# Patient Record
Sex: Female | Born: 2000 | Race: Black or African American | Hispanic: No | Marital: Single | Smoking: Never smoker
Health system: Southern US, Community
[De-identification: ages and names within clinical notes are randomized; demographics above are authoritative.]

## PROBLEM LIST (undated history)

## (undated) ENCOUNTER — Ambulatory Visit: Payer: Medicaid Other | Source: Home / Self Care

## (undated) ENCOUNTER — Inpatient Hospital Stay (HOSPITAL_COMMUNITY): Payer: Self-pay

## (undated) DIAGNOSIS — A749 Chlamydial infection, unspecified: Secondary | ICD-10-CM

---
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
## 2022-03-12 ENCOUNTER — Ambulatory Visit
Admission: EM | Admit: 2022-03-12 | Discharge: 2022-03-12 | Disposition: A | Discharge status: Home / Self Care | Payer: Medicaid Other | Attending: Internal Medicine | Admitting: Internal Medicine

## 2022-03-12 DIAGNOSIS — R112 Nausea with vomiting, unspecified: Secondary | ICD-10-CM | POA: Diagnosis not present

## 2022-03-12 DIAGNOSIS — B349 Viral infection, unspecified: Secondary | ICD-10-CM | POA: Diagnosis not present

## 2022-03-12 MED ORDER — ONDANSETRON 4 MG PO TBDP: 4.0000 mg | ORAL_TABLET | Freq: Three times a day (TID) | ORAL | 0 refills | Status: DC | PRN

## 2022-03-12 NOTE — ED Triage Notes (Signed)
Pt c/o vomiting since last night. Denies diarrhea, constipation.

## 2022-03-12 NOTE — Discharge Instructions (Signed)
It appears that you have a viral illness that should run its course and self resolve with symptomatic treatment.  I have prescribed you nausea medication to take as needed.  Please ensure adequate fluid hydration with clear oral fluids that include Gatorade, Pedialyte, water.  Follow-up with emergency department if symptoms persist or worsen or if you cannot keep any fluids down despite nausea medication.

## 2022-03-12 NOTE — ED Provider Notes (Addendum)
[name]    CSN: [id number] Arrival date & time: 03/12/22  1107      History   Chief Complaint Chief Complaint  Patient presents with   Emesis    HPI Sherry Barton is a 21 y.o. female.   Patient presents with nausea and vomiting that started last night.  Denies any blood in emesis.  She also denies any associated diarrhea.  She does report some lower abdominal pain described as cramping but reports this is consistent given that she is currently on her menstrual cycle.  Denies any associated upper respiratory symptoms, cough, fever.  Denies any known sick contacts, recent unfavorable foods, or travel outside the [place].  Patient reports that she is having difficulty keeping fluids down.  She has had unprotected intercourse a few months prior but states that she has been having monthly menstrual cycles since so she is not concerned for pregnancy.   Emesis   History reviewed. No pertinent past medical history.  There are no problems to display for this patient.   History reviewed. No pertinent surgical history.  OB History   No obstetric history on file.      Home Medications    Prior to Admission medications   Medication Sig Start Date End Date Taking? Authorizing Provider  ondansetron (ZOFRAN-ODT) 4 MG disintegrating tablet Take 1 tablet (4 mg total) by mouth every 8 (eight) hours as needed for nausea or vomiting. 03/12/22  Yes Zariyah Stephens, Michele Rockers, FNP    Family History Family History  Problem Relation Age of Onset   Healthy Father     Social History Social History   Tobacco Use   Smoking status: Never   Smokeless tobacco: Never  Substance Use Topics   Alcohol use: Never   Drug use: Yes    Types: Marijuana     Allergies   Patient has no known allergies.   Review of Systems Review of Systems Per HPI  Physical Exam Triage Vital Signs ED Triage Vitals [03/12/22 1207]  Enc Vitals Group     BP 110/66     Pulse Rate 77      Resp 16     Temp 98.8 F (37.1 C)     Temp Source Oral     SpO2 97 %     Weight      Height      Head Circumference      Peak Flow      Pain Score 7     Pain Loc      Pain Edu?      Excl. in [place]?    No data found.  Updated Vital Signs BP 110/66 (BP Location: Right Arm)   Pulse 77   Temp 98.8 F (37.1 C) (Oral)   Resp 16   SpO2 97%   Visual Acuity Right Eye Distance:   Left Eye Distance:   Bilateral Distance:    Right Eye Near:   Left Eye Near:    Bilateral Near:     Physical Exam Constitutional:      General: She is not in acute distress.    Appearance: Normal appearance. She is not toxic-appearing or diaphoretic.  HENT:     Head: Normocephalic and atraumatic.     Mouth/Throat:     Mouth: Mucous membranes are moist.     Pharynx: No posterior oropharyngeal erythema.  Eyes:     Extraocular Movements: Extraocular movements intact.     Conjunctiva/sclera: Conjunctivae normal.  Cardiovascular:  Rate and Rhythm: Normal rate and regular rhythm.     Pulses: Normal pulses.     Heart sounds: Normal heart sounds.  Pulmonary:     Effort: Pulmonary effort is normal. No respiratory distress.     Breath sounds: Normal breath sounds.  Abdominal:     General: Bowel sounds are normal. There is no distension.     Palpations: Abdomen is soft.     Tenderness: There is no abdominal tenderness.  Neurological:     General: No focal deficit present.     Mental Status: She is alert and oriented to person, place, and time. Mental status is at baseline.  Psychiatric:        Mood and Affect: Mood normal.        Behavior: Behavior normal.        Thought Content: Thought content normal.        Judgment: Judgment normal.      UC Treatments / Results  Labs (all labs ordered are listed, but only abnormal results are displayed) Labs Reviewed - No data to display  EKG   Radiology No results found.  Procedures Procedures (including critical care time)  Medications  Ordered in UC Medications - No data to display  Initial Impression / Assessment and Plan / UC Course  I have reviewed the triage vital signs and the nursing notes.  Pertinent labs & imaging results that were available during my care of the patient were reviewed by me and considered in my medical decision making (see chart for details).     Suspect viral cause to patient's symptoms.  Patient has been having monthly menstrual cycles and is currently on her menstrual cycle so there is no concern for pregnancy at this time.  No signs of dehydration or concerns for acute abdomen on exam so do not think an emergent evaluation or imaging of the abdomen is necessary.  Will treat with ondansetron and patient advised to ensure adequate fluid hydration with clear oral fluids.  She does not take any daily medications so this medication should be safe.  Patient advised to go to the ER if symptoms persist or worsen.  Patient verbalized understanding and was agreeable with plan. Final Clinical Impressions(s) / UC Diagnoses   Final diagnoses:  Nausea and vomiting, unspecified vomiting type  Viral illness     Discharge Instructions      It appears that you have a viral illness that should run its course and self resolve with symptomatic treatment.  I have prescribed you nausea medication to take as needed.  Please ensure adequate fluid hydration with clear oral fluids that include Gatorade, Pedialyte, water.  Follow-up with emergency department if symptoms persist or worsen or if you cannot keep any fluids down despite nausea medication.    ED Prescriptions     Medication Sig Dispense Auth. Provider   ondansetron (ZOFRAN-ODT) 4 MG disintegrating tablet Take 1 tablet (4 mg total) by mouth every 8 (eight) hours as needed for nausea or vomiting. 20 tablet [place], Acie Fredrickson, [place]      PDMP not reviewed this encounter.   Gustavus Bryant, [place] 03/12/22 1235    Gustavus Bryant, [place] 03/12/22 1236

## 2022-10-02 ENCOUNTER — Ambulatory Visit: Payer: Self-pay

## 2022-10-02 ENCOUNTER — Ambulatory Visit (INDEPENDENT_AMBULATORY_CARE_PROVIDER_SITE_OTHER): Payer: Medicaid Other

## 2022-10-02 ENCOUNTER — Ambulatory Visit
Admission: EM | Admit: 2022-10-02 | Discharge: 2022-10-02 | Disposition: A | Discharge status: Home / Self Care | Payer: Medicaid Other | Attending: Internal Medicine | Admitting: Internal Medicine

## 2022-10-02 DIAGNOSIS — M542 Cervicalgia: Secondary | ICD-10-CM

## 2022-10-02 DIAGNOSIS — M545 Low back pain, unspecified: Secondary | ICD-10-CM

## 2022-10-02 MED ORDER — TIZANIDINE HCL 2 MG PO TABS: 2.0000 mg | ORAL_TABLET | Freq: Two times a day (BID) | ORAL | 0 refills | Status: DC | PRN

## 2022-10-02 NOTE — Discharge Instructions (Signed)
Your x-rays were normal.  Suspect muscle strain.  I have prescribed you a muscle relaxer to take as needed for this.  Please remember that it can make you drowsy so no driving or drinking alcohol with it.  Follow-up with orthopedist if symptoms persist or worsen.
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
## 2023-04-28 ENCOUNTER — Ambulatory Visit
Admission: RE | Admit: 2023-04-28 | Discharge: 2023-04-28 | Disposition: A | Discharge status: Home / Self Care | Payer: Medicaid Other | Source: Ambulatory Visit

## 2023-04-28 ENCOUNTER — Ambulatory Visit (INDEPENDENT_AMBULATORY_CARE_PROVIDER_SITE_OTHER): Payer: Medicaid Other

## 2023-04-28 VITALS — BP 93/58 | HR 69 | Temp 98.6°F | Resp 16 | Ht 63.0 in | Wt 100.0 lb

## 2023-04-28 DIAGNOSIS — R059 Cough, unspecified: Secondary | ICD-10-CM | POA: Diagnosis not present

## 2023-04-28 DIAGNOSIS — J40 Bronchitis, not specified as acute or chronic: Secondary | ICD-10-CM | POA: Diagnosis not present

## 2023-04-28 DIAGNOSIS — J329 Chronic sinusitis, unspecified: Secondary | ICD-10-CM | POA: Diagnosis not present

## 2023-04-28 MED ORDER — PREDNISONE 20 MG PO TABS: 40.0000 mg | ORAL_TABLET | Freq: Every day | ORAL | 0 refills | Status: AC

## 2023-04-28 MED ORDER — AMOXICILLIN-POT CLAVULANATE 875-125 MG PO TABS: 1.0000 | ORAL_TABLET | Freq: Two times a day (BID) | ORAL | 0 refills | Status: DC

## 2023-04-28 NOTE — ED Provider Notes (Signed)
[name]    CSN: [id number] Arrival date & time: 04/28/23  1154      History   Chief Complaint Chief Complaint  Patient presents with   Cough    HPI Sherry Barton is a 22 y.o. female.   Patient here today for evaluation of chest congestion and body aches. She reports symptoms started about a week ago. She has not had any fever. She has taken OTC meds without resolution .   The history is provided by the patient.  Cough Associated symptoms: sore throat   Associated symptoms: no chills, no ear pain, no eye discharge, no fever, no shortness of breath and no wheezing     History reviewed. No pertinent past medical history.  There are no active problems to display for this patient.   History reviewed. No pertinent surgical history.  OB History   No obstetric history on file.      Home Medications    Prior to Admission medications   Medication Sig Start Date End Date Taking? Authorizing Provider  amoxicillin-clavulanate (AUGMENTIN) 875-125 MG tablet Take 1 tablet by mouth every 12 (twelve) hours. 04/28/23  Yes Tomi Bamberger, PA-C  carisoprodol (SOMA) 250 MG tablet Take 250 mg by mouth 2 (two) times daily. 08/12/21  Yes [provider]  Pseudoeph-Doxylamine-DM-APAP (NYQUIL PO) Take by mouth.   Yes [provider]  ondansetron (ZOFRAN-ODT) 4 MG disintegrating tablet Take 1 tablet (4 mg total) by mouth every 8 (eight) hours as needed for nausea or vomiting. 03/12/22   Gustavus Bryant, FNP  tiZANidine (ZANAFLEX) 2 MG tablet Take 1 tablet (2 mg total) by mouth every 12 (twelve) hours as needed for muscle spasms. 10/02/22   Gustavus Bryant, FNP    Family History Family History  Problem Relation Age of Onset   Healthy Father     Social History Social History   Tobacco Use   Smoking status: Never    Passive exposure: Never   Smokeless tobacco: Never  Vaping Use   Vaping status: Never Used  Substance Use Topics   Alcohol use:  Never   Drug use: Not Currently    Types: Marijuana     Allergies   Patient has no known allergies.   Review of Systems Review of Systems  Constitutional:  Negative for chills and fever.  HENT:  Positive for congestion, sinus pressure and sore throat. Negative for ear pain.   Eyes:  Negative for discharge and redness.  Respiratory:  Positive for cough. Negative for shortness of breath and wheezing.   Gastrointestinal:  Negative for abdominal pain, diarrhea, nausea and vomiting.     Physical Exam Triage Vital Signs ED Triage Vitals  Encounter Vitals Group     BP 04/28/23 1205 (!) 93/58     Systolic BP Percentile --      Diastolic BP Percentile --      Pulse Rate 04/28/23 1205 69     Resp 04/28/23 1205 16     Temp 04/28/23 1205 98.6 F (37 C)     Temp Source 04/28/23 1205 Oral     SpO2 04/28/23 1205 99 %     Weight 04/28/23 1202 100 lb (45.4 kg)     Height 04/28/23 1202 5\' 3"  (1.6 m)     Head Circumference --      Peak Flow --      Pain Score 04/28/23 1202 8     Pain Loc --      Pain  Education --      Exclude from [employer] Chart --    No data found.  Updated Vital Signs BP (!) 93/58 (BP Location: Left Arm) Comment: "normal" for me.  Pulse 69   Temp 98.6 F (37 C) (Oral)   Resp 16   Ht 5\' 3"  (1.6 m)   Wt 100 lb (45.4 kg)   LMP 04/21/2023 (Exact Date)   SpO2 99%   BMI 17.71 kg/m   Visual Acuity Right Eye Distance:   Left Eye Distance:   Bilateral Distance:    Right Eye Near:   Left Eye Near:    Bilateral Near:     Physical Exam Vitals and nursing note reviewed.  Constitutional:      General: She is not in acute distress.    Appearance: Normal appearance. She is not ill-appearing.  HENT:     Head: Normocephalic and atraumatic.     Nose: Congestion present.     Mouth/Throat:     Mouth: Mucous membranes are moist.     Pharynx: No oropharyngeal exudate or posterior oropharyngeal erythema.  Eyes:     Conjunctiva/sclera: Conjunctivae normal.   Cardiovascular:     Rate and Rhythm: Normal rate and regular rhythm.     Heart sounds: Normal heart sounds. No murmur heard. Pulmonary:     Effort: Pulmonary effort is normal. No respiratory distress.     Breath sounds: Normal breath sounds. No wheezing, rhonchi or rales.  Skin:    General: Skin is warm and dry.  Neurological:     Mental Status: She is alert.  Psychiatric:        Mood and Affect: Mood normal.        Thought Content: Thought content normal.      UC Treatments / Results  Labs (all labs ordered are listed, but only abnormal results are displayed) Labs Reviewed - No data to display  EKG   Radiology No results found.   Procedures Procedures (including critical care time)  Medications Ordered in UC Medications - No data to display  Initial Impression / Assessment and Plan / UC Course  I have reviewed the triage vital signs and the nursing notes.  Pertinent labs & imaging results that were available during my care of the patient were reviewed by me and considered in my medical decision making (see chart for details).    CXR without acute findings. Will treat to cover possible sinobronchitis with steroid burst and augmentin. Advised follow up if no gradual improvement with treatment or with any further concerns.   Final Clinical Impressions(s) / UC Diagnoses   Final diagnoses:  Cough, unspecified type  Sinobronchitis   Discharge Instructions   None    ED Prescriptions     Medication Sig Dispense Auth. Provider   amoxicillin-clavulanate (AUGMENTIN) 875-125 MG tablet Take 1 tablet by mouth every 12 (twelve) hours. 14 tablet Erma Pinto F, PA-C   predniSONE (DELTASONE) 20 MG tablet Take 2 tablets (40 mg total) by mouth daily with breakfast for 5 days. 10 tablet Tomi Bamberger, PA-C      PDMP not reviewed this encounter.   Tomi Bamberger, PA-C 05/07/23 2221

## 2023-04-28 NOTE — ED Triage Notes (Signed)
Body pain and congestion chest and nose - Entered by patient  Additional details: "Symptoms started about a week ago, now with congested cough as well". No fever.

## 2023-04-30 ENCOUNTER — Ambulatory Visit: Payer: Medicaid Other

## 2023-05-09 NOTE — L&D Delivery Note (Signed)
    Sherry Barton is a 23 y.o. G1P0 s/p VD at [redacted]w[redacted]d. She was admitted for IOL s/t PreE with SF.   ROM: 10h 21m with clear fluid GBS Status: POSITIVE/-- (11/15 0236) Maximum Maternal Temperature: 98.4  Labor Progress: Patient admitted after severe blood pressures noted.  She was started on magnesium sulfate and PCN for prophylaxis. Induction included foley bulb, misoprostol, and pitocin with progression to complete dilation.  Respiratory therapist called, prior to delivery, to be at bedside for anticipated delayed fetal extrauterine transition s/t magnesium.   Delivery Date/Time: Sunday March 23, 2024 at 0053 Delivery: Called to room and patient was complete and pushing. Head delivered in LOA with compound right hand. No nuchal cord present Shoulders delivered easily and SO-Jason encouraged to delivery body. Infant immediately placed on mother's abdomen and noted to have flaccid tone, but good color and heart rate. Tactile stimulation given by provider and nurse.   After 1 minute delay, the umbilical cord was clamped, cut by support person-Jason, and blood collected by medical student. Infant to warmer to respiratory therapist. TXA started. Placenta delivered spontaneously via Keren and was noted to be intact with 3VC upon inspection by medical student.  Cord sample obtained and pitocin initiated. Vaginal inspection revealed a right labial laceration that was repaired with 3-0 vicryl on SH.  No additional anesthetic necessary and patient tolerated the procedure well. Fundus firm, at the umbilicus, but bleeding constant. Sweep of lower uterine segment yields ~148mL of clots.  Bleeding improved. Foley catheter placed, by medical student, with clear yellow urine noted. Reviewed immediate postpartum care including fundal checks, pain medication, and continuation of magnesium sulfate for 24 hours.   Mother hemodynamically stable and infant skin to skin prior to provider exit.   Placenta:  Disposal Complications:  -Labor/Delivery:PreEclampsia, Magnesium Sulfate Infusion -Prenatal: Symptomatic Anemia, Alpha Thalassemia silent carrier Lacerations: Right Labial-Repaired EBL: 384 Analgesia: Epidural  Postpartum Planning -Feeding: Both -Circumcision: N/A -Anticipated BCM: Patch -PP Message Sent -Discharge Summary Shared  Infant: Female-J'Ream  APGARs 7, 9  2660g, 5lbs 13.8oz, 19.75in  Harlene LITTIE Duncans, CNM  03/23/2024 2:05 AM  Delivery Report: Review the Delivery Report for details.

## 2023-07-01 ENCOUNTER — Other Ambulatory Visit: Payer: Self-pay

## 2023-07-01 ENCOUNTER — Encounter (HOSPITAL_COMMUNITY): Payer: Self-pay | Admitting: Emergency Medicine

## 2023-07-01 ENCOUNTER — Emergency Department (HOSPITAL_COMMUNITY)
Admission: EM | Admit: 2023-07-01 | Discharge: 2023-07-02 | Discharge status: Against Medical Advice | Payer: Medicaid Other | Attending: Emergency Medicine | Admitting: Emergency Medicine

## 2023-07-01 DIAGNOSIS — Z5321 Procedure and treatment not carried out due to patient leaving prior to being seen by health care provider: Secondary | ICD-10-CM | POA: Diagnosis not present

## 2023-07-01 DIAGNOSIS — R112 Nausea with vomiting, unspecified: Secondary | ICD-10-CM | POA: Diagnosis present

## 2023-07-01 NOTE — ED Triage Notes (Signed)
 Pt in with reported nausea x 3 days, states emesis began 1hr ago and she has had 6 episodes. Mother is also sick with similar s/s at this time. Denies any diarrhea or blood in vomit

## 2023-07-02 LAB — URINALYSIS, ROUTINE W REFLEX MICROSCOPIC
Bilirubin Urine: NEGATIVE
Glucose, UA: NEGATIVE mg/dL
Hgb urine dipstick: NEGATIVE
Ketones, ur: NEGATIVE mg/dL
Leukocytes,Ua: NEGATIVE
Nitrite: NEGATIVE
Protein, ur: NEGATIVE mg/dL
Specific Gravity, Urine: 1.021 (ref 1.005–1.030)
pH: 5 (ref 5.0–8.0)

## 2023-07-02 LAB — RESP PANEL BY RT-PCR (RSV, FLU A&B, COVID)  RVPGX2
Influenza A by PCR: NEGATIVE
Influenza B by PCR: NEGATIVE
Resp Syncytial Virus by PCR: NEGATIVE
SARS Coronavirus 2 by RT PCR: NEGATIVE

## 2023-07-02 LAB — PREGNANCY, URINE: Preg Test, Ur: NEGATIVE

## 2023-07-02 MED ORDER — ONDANSETRON 4 MG PO TBDP
4.0000 mg | ORAL_TABLET | Freq: Once | ORAL | Status: AC | PRN
  Administered 2023-07-02: 4 mg via ORAL
  Filled 2023-07-02: qty 1

## 2023-07-02 NOTE — ED Notes (Signed)
 Pt visitor stated they were leaving due to wait

## 2023-08-09 ENCOUNTER — Other Ambulatory Visit (HOSPITAL_COMMUNITY)
Admission: RE | Admit: 2023-08-09 | Discharge: 2023-08-09 | Disposition: A | Discharge status: Home / Self Care | Source: Ambulatory Visit | Attending: Obstetrics and Gynecology | Admitting: Obstetrics and Gynecology

## 2023-08-09 ENCOUNTER — Ambulatory Visit

## 2023-08-09 VITALS — BP 112/76 | HR 70

## 2023-08-09 DIAGNOSIS — Z113 Encounter for screening for infections with a predominantly sexual mode of transmission: Secondary | ICD-10-CM

## 2023-08-09 DIAGNOSIS — B9689 Other specified bacterial agents as the cause of diseases classified elsewhere: Secondary | ICD-10-CM | POA: Diagnosis not present

## 2023-08-09 DIAGNOSIS — N760 Acute vaginitis: Secondary | ICD-10-CM | POA: Diagnosis not present

## 2023-08-09 LAB — HEPATITIS C ANTIBODY: HCV Ab: NEGATIVE

## 2023-08-09 NOTE — Progress Notes (Signed)
 SUBJECTIVE:  23 y.o. female who desires a STI screen. Denies abnormal vaginal discharge, bleeding or significant pelvic pain. No UTI symptoms. Denies history of known exposure to STD. Pt does not have symptoms, but reports noticing a bump that was concerning to her, so would like to be tested for all STDs.   Patient's last menstrual period was 06/21/2023 (exact date). Pt currently [redacted]w[redacted]d pregnant.  OBJECTIVE:  She appears well.   ASSESSMENT:  STI Screen   PLAN:  Pt offered STI blood screening-requested GC, chlamydia, and trichomonas probe sent to lab.  Treatment: To be determined once lab results are received.  Pt follow up as needed.

## 2023-08-10 LAB — HEPATITIS B SURFACE ANTIGEN: Hepatitis B Surface Ag: NEGATIVE

## 2023-08-10 LAB — RPR: RPR Ser Ql: NONREACTIVE

## 2023-08-10 LAB — HEPATITIS C ANTIBODY: Hep C Virus Ab: NONREACTIVE

## 2023-08-10 LAB — HIV ANTIBODY (ROUTINE TESTING W REFLEX): HIV Screen 4th Generation wRfx: NONREACTIVE

## 2023-08-12 ENCOUNTER — Inpatient Hospital Stay (HOSPITAL_COMMUNITY)
Admission: AD | Admit: 2023-08-12 | Discharge: 2023-08-12 | Disposition: A | Discharge status: Home / Self Care | Attending: Obstetrics and Gynecology | Admitting: Obstetrics and Gynecology

## 2023-08-12 ENCOUNTER — Encounter (HOSPITAL_COMMUNITY): Payer: Self-pay | Admitting: *Deleted

## 2023-08-12 ENCOUNTER — Inpatient Hospital Stay (HOSPITAL_COMMUNITY)

## 2023-08-12 DIAGNOSIS — Z3A01 Less than 8 weeks gestation of pregnancy: Secondary | ICD-10-CM | POA: Insufficient documentation

## 2023-08-12 DIAGNOSIS — R1010 Upper abdominal pain, unspecified: Secondary | ICD-10-CM | POA: Diagnosis present

## 2023-08-12 DIAGNOSIS — O219 Vomiting of pregnancy, unspecified: Secondary | ICD-10-CM | POA: Insufficient documentation

## 2023-08-12 DIAGNOSIS — Z3491 Encounter for supervision of normal pregnancy, unspecified, first trimester: Secondary | ICD-10-CM

## 2023-08-12 HISTORY — DX: Chlamydial infection, unspecified: A74.9

## 2023-08-12 LAB — CBC
HCT: 37.9 % (ref 36.0–46.0)
Hemoglobin: 12.2 g/dL (ref 12.0–15.0)
MCH: 25.3 pg — ABNORMAL LOW (ref 26.0–34.0)
MCHC: 32.2 g/dL (ref 30.0–36.0)
MCV: 78.5 fL — ABNORMAL LOW (ref 80.0–100.0)
Platelets: 320 10*3/uL (ref 150–400)
RBC: 4.83 MIL/uL (ref 3.87–5.11)
RDW: 16.8 % — ABNORMAL HIGH (ref 11.5–15.5)
WBC: 6.4 10*3/uL (ref 4.0–10.5)
nRBC: 0 % (ref 0.0–0.2)

## 2023-08-12 LAB — HCG, QUANTITATIVE, PREGNANCY: hCG, Beta Chain, Quant, S: 105165 m[IU]/mL — ABNORMAL HIGH (ref <5)

## 2023-08-12 LAB — URINALYSIS, ROUTINE W REFLEX MICROSCOPIC
Bacteria, UA: NONE SEEN
Bilirubin Urine: NEGATIVE
Glucose, UA: NEGATIVE mg/dL
Hgb urine dipstick: NEGATIVE
Ketones, ur: 80 mg/dL — AB
Leukocytes,Ua: NEGATIVE
Nitrite: NEGATIVE
Protein, ur: 30 mg/dL — AB
Specific Gravity, Urine: 1.024 (ref 1.005–1.030)
pH: 6 (ref 5.0–8.0)

## 2023-08-12 MED ORDER — PROCHLORPERAZINE MALEATE 10 MG PO TABS: 10.0000 mg | ORAL_TABLET | Freq: Three times a day (TID) | ORAL | 1 refills | Status: DC | PRN

## 2023-08-12 MED ORDER — PROCHLORPERAZINE EDISYLATE 10 MG/2ML IJ SOLN
10.0000 mg | Freq: Once | INTRAMUSCULAR | Status: AC
  Administered 2023-08-12: 10 mg via INTRAVENOUS
  Filled 2023-08-12: qty 2

## 2023-08-12 MED ORDER — LACTATED RINGERS IV BOLUS
1000.0000 mL | Freq: Once | INTRAVENOUS | Status: AC
Start: 2023-08-12 — End: 2023-08-12
  Administered 2023-08-12: 1000 mL via INTRAVENOUS

## 2023-08-12 MED ORDER — LACTATED RINGERS IV SOLN: INTRAVENOUS | Status: DC

## 2023-08-12 MED ORDER — FAMOTIDINE IN NACL 20-0.9 MG/50ML-% IV SOLN
20.0000 mg | Freq: Once | INTRAVENOUS | Status: AC
  Administered 2023-08-12: 20 mg via INTRAVENOUS
  Filled 2023-08-12: qty 50

## 2023-08-12 NOTE — MAU Provider Note (Signed)
 History     CSN: [id number]  Arrival date and time: 08/12/23 1103   Event Date/Time   First Provider Initiated Contact with Patient 08/12/23 1334      Chief Complaint  Patient presents with   Abdominal Pain   Nausea   Emesis   HPI Sherry Barton is a 23 y.o. G1P0 at [redacted]w[redacted]d who presents with n/v & abdominal pain. Symptoms started yesterday. States she vomited once yesterday & once this morning. Doesn't have antiemetic at home. Reports epigastric pain & dizziness since symptoms started. Hasn't treated symptoms. Denies lower abdominal pain, diarrhea, fever, vaginal bleeding.   OB History     Gravida  1   Para      Term      Preterm      AB      Living         SAB      IAB      Ectopic      Multiple      Live Births              Past Medical History:  Diagnosis Date   Chlamydia     Past Surgical History:  Procedure Laterality Date   NO PAST SURGERIES      Family History  Problem Relation Age of Onset   Healthy Mother    Healthy Father     Social History   Tobacco Use   Smoking status: Never    Passive exposure: Never   Smokeless tobacco: Never  Vaping Use   Vaping status: Never Used  Substance Use Topics   Alcohol use: Never   Drug use: Not Currently    Types: Marijuana    Comment: been a long time, sometime 2024    Allergies: No Known Allergies  Medications Prior to Admission  Medication Sig Dispense Refill Last Dose/Taking   Prenatal Vit-Fe Fumarate-FA (MULTIVITAMIN-PRENATAL) 27-0.8 MG TABS tablet Take 1 tablet by mouth daily at 12 noon.   08/10/2023   amoxicillin-clavulanate (AUGMENTIN) 875-125 MG tablet Take 1 tablet by mouth every 12 (twelve) hours. (Patient not taking: Reported on 08/09/2023) 14 tablet 0    carisoprodol (SOMA) 250 MG tablet Take 250 mg by mouth 2 (two) times daily. (Patient not taking: Reported on 08/09/2023)      ondansetron (ZOFRAN-ODT) 4 MG disintegrating tablet Take 1 tablet (4 mg total) by mouth every 8 (eight)  hours as needed for nausea or vomiting. (Patient not taking: Reported on 08/09/2023) 20 tablet 0    Pseudoeph-Doxylamine-DM-APAP (NYQUIL PO) Take by mouth. (Patient not taking: Reported on 08/09/2023)      tiZANidine (ZANAFLEX) 2 MG tablet Take 1 tablet (2 mg total) by mouth every 12 (twelve) hours as needed for muscle spasms. (Patient not taking: Reported on 08/09/2023) 20 tablet 0     Review of Systems  All other systems reviewed and are negative.  Physical Exam   Blood pressure 129/72, pulse 93, temperature 98.8 F (37.1 C), temperature source Oral, resp. rate 16, height 5\' 3"  (1.6 m), weight 43.9 kg, last menstrual period 06/21/2023, SpO2 100%.  Physical Exam Vitals and nursing note reviewed.  Constitutional:      General: She is not in acute distress.    Appearance: She is well-developed. She is not ill-appearing.  HENT:     Head: Normocephalic and atraumatic.  Eyes:     General: No scleral icterus.       Right eye: No discharge.  Left eye: No discharge.     Conjunctiva/sclera: Conjunctivae normal.  Pulmonary:     Effort: Pulmonary effort is normal. No respiratory distress.  Abdominal:     General: Abdomen is flat.     Palpations: Abdomen is soft.     Tenderness: There is no abdominal tenderness.  Skin:    General: Skin is warm and dry.  Neurological:     General: No focal deficit present.     Mental Status: She is alert.  Psychiatric:        Mood and Affect: Mood normal.        Behavior: Behavior normal.     MAU Course  Procedures Results for orders placed or performed during the hospital encounter of 08/12/23 (from the past 24 hours)  ABO/Rh     Status: None   Collection Time: 08/12/23 12:02 PM  Result Value Ref Range   ABO/RH(D)      O POS Performed at [facility], [address] [place], [place] [address]   CBC     Status: Abnormal   Collection Time: 08/12/23 12:07 PM  Result Value Ref Range   WBC 6.4 4.0 - 10.5 K/uL   RBC 4.83 3.87 - 5.11  MIL/uL   Hemoglobin 12.2 12.0 - 15.0 g/dL   HCT 60.4 54.0 - 98.1 %   MCV 78.5 (L) 80.0 - 100.0 fL   MCH 25.3 (L) 26.0 - 34.0 pg   MCHC 32.2 30.0 - 36.0 g/dL   RDW 19.1 (H) 47.8 - 29.5 %   Platelets 320 150 - 400 K/uL   nRBC 0.0 0.0 - 0.2 %  hCG, quantitative, pregnancy     Status: Abnormal   Collection Time: 08/12/23 12:07 PM  Result Value Ref Range   hCG, Beta Chain, Quant, S 105,165 (H) <5 mIU/mL  Urinalysis, Routine w reflex microscopic -Urine, Clean Catch     Status: Abnormal   Collection Time: 08/12/23 12:16 PM  Result Value Ref Range   Color, Urine YELLOW YELLOW   APPearance HAZY (A) CLEAR   Specific Gravity, Urine 1.024 1.005 - 1.030   pH 6.0 5.0 - 8.0   Glucose, UA NEGATIVE NEGATIVE mg/dL   Hgb urine dipstick NEGATIVE NEGATIVE   Bilirubin Urine NEGATIVE NEGATIVE   Ketones, ur 80 (A) NEGATIVE mg/dL   Protein, ur 30 (A) NEGATIVE mg/dL   Nitrite NEGATIVE NEGATIVE   Leukocytes,Ua NEGATIVE NEGATIVE   RBC / HPF 0-5 0 - 5 RBC/hpf   WBC, UA 0-5 0 - 5 WBC/hpf   Bacteria, UA NONE SEEN NONE SEEN   Squamous Epithelial / HPF 6-10 0 - 5 /HPF   Mucus PRESENT    US OB LESS THAN 14 WEEKS WITH OB TRANSVAGINAL Result Date: 08/12/2023 CLINICAL DATA:  Abdominal pain. LMP: 06/21/2023 corresponding to an estimated gestational age of [redacted] weeks, 3 days. EXAM: OBSTETRIC <14 WK [place] AND TRANSVAGINAL OB US TECHNIQUE: Both transabdominal and transvaginal ultrasound examinations were performed for complete evaluation of the gestation as well as the maternal uterus, adnexal regions, and pelvic cul-de-sac. Transvaginal technique was performed to assess early pregnancy. COMPARISON:  None Available. FINDINGS: Intrauterine gestational sac: Single intrauterine gestational sac. Yolk sac:  Seen Embryo:  Present Cardiac Activity: Detected Heart Rate: 120 bpm CRL:  5 mm   6 w   1 d                  [place] EDC: 04/05/2024 Subchorionic hemorrhage: Small subchorionic hemorrhage measuring up to 1  cm in length. Maternal  uterus/adnexae: The maternal ovaries unremarkable. There is a corpus luteum in the right ovary. IMPRESSION: Single live intrauterine pregnancy with an estimated gestational age of [redacted] weeks, 1 day by ultrasound. Electronically Signed   By: Elgie Collard M.D.   On: 08/12/2023 15:51    MDM   Assessment and Plan   1. Nausea and vomiting during pregnancy   2. Normal IUP (intrauterine pregnancy) on prenatal ultrasound, first trimester   3. [redacted] weeks gestation of pregnancy    -Presents with n/v & epigastric pain. Treated with IV fluids, compazine & pepcid. Symptoms improved. No vomiting in MAU. Rx compazine prn to use at home -Ultrasound shows live IUP measuring [redacted]w[redacted]d. EDD updated -Reviewed reasons to return to MAU  Judeth Horn 08/12/2023, 4:23 PM

## 2023-08-12 NOTE — MAU Note (Addendum)
 Sherry Barton is a 23 y.o. at [redacted]w[redacted]d here in MAU reporting: been throwing up since yesterday. Feels dizzy and is having stomach pain- especially at the top.  No meds for nausea yet, first appt is on the 16th. Has only had preg confirmed.  Denies fever, diarrhea or constipation.  No vag bleeding.  Onset of complaint: yesterday Pain score:severe Vitals:   08/12/23 1135  BP: 129/72  Pulse: 93  Resp: 16  Temp: 98.8 F (37.1 C)  SpO2: 100%      Lab orders placed from triage:  urine   Hx updated,  pt ate at 0900, was able to keep it down.  Has not thrown up since 0500

## 2023-08-13 ENCOUNTER — Other Ambulatory Visit: Payer: Self-pay

## 2023-08-13 ENCOUNTER — Encounter: Payer: Self-pay | Admitting: Obstetrics and Gynecology

## 2023-08-13 LAB — CERVICOVAGINAL ANCILLARY ONLY
Bacterial Vaginitis (gardnerella): POSITIVE — AB
Candida Glabrata: NEGATIVE
Candida Vaginitis: NEGATIVE
Chlamydia: NEGATIVE
Comment: NEGATIVE
Comment: NEGATIVE
Comment: NEGATIVE
Comment: NEGATIVE
Comment: NEGATIVE
Comment: NORMAL
Neisseria Gonorrhea: NEGATIVE
Trichomonas: NEGATIVE

## 2023-08-13 LAB — ABO/RH: ABO/RH(D): O POS

## 2023-08-13 MED ORDER — METRONIDAZOLE 500 MG PO TABS: 500.0000 mg | ORAL_TABLET | Freq: Two times a day (BID) | ORAL | 0 refills | Status: DC

## 2023-08-13 NOTE — Progress Notes (Signed)
 Flagyl rx sent for bv

## 2023-08-14 ENCOUNTER — Other Ambulatory Visit: Payer: Self-pay

## 2023-08-14 MED ORDER — PROMETHAZINE HCL 25 MG PO TABS: 25.0000 mg | ORAL_TABLET | Freq: Four times a day (QID) | ORAL | 1 refills | Status: DC | PRN

## 2023-08-14 NOTE — Progress Notes (Signed)
 Pt called reporting compazine that was prescribed in MAU is making her have weird dreams and she is feeling off. Rx for phenergan sent in per protocol for pt to try. Informed pt to let us know if still having same symptoms.

## 2023-08-22 ENCOUNTER — Ambulatory Visit

## 2023-08-22 ENCOUNTER — Other Ambulatory Visit (INDEPENDENT_AMBULATORY_CARE_PROVIDER_SITE_OTHER): Payer: Self-pay

## 2023-08-22 VITALS — BP 128/77 | HR 87 | Wt 100.6 lb

## 2023-08-22 DIAGNOSIS — Z3A01 Less than 8 weeks gestation of pregnancy: Secondary | ICD-10-CM

## 2023-08-22 DIAGNOSIS — Z3401 Encounter for supervision of normal first pregnancy, first trimester: Secondary | ICD-10-CM | POA: Diagnosis not present

## 2023-08-22 DIAGNOSIS — Z1339 Encounter for screening examination for other mental health and behavioral disorders: Secondary | ICD-10-CM

## 2023-08-22 DIAGNOSIS — Z3480 Encounter for supervision of other normal pregnancy, unspecified trimester: Secondary | ICD-10-CM | POA: Insufficient documentation

## 2023-08-22 MED ORDER — BLOOD PRESSURE KIT DEVI: 1.0000 | 0 refills | Status: AC

## 2023-08-22 NOTE — Addendum Note (Signed)
 Addended by: Valley Gavia on: 08/22/2023 10:03 AM   Modules accepted: Orders

## 2023-08-22 NOTE — Progress Notes (Signed)
 New OB Intake  I connected with Sherry Barton  on 08/22/23 at  9:15 AM EDT by in person and verified that I am speaking with the correct person using two identifiers. Nurse is located at [facility] and pt is located at [place].  I discussed the limitations, risks, security and privacy concerns of performing an evaluation and management service by telephone and the availability of in person appointments. I also discussed with the patient that there may be a patient responsible charge related to this service. The patient expressed understanding and agreed to proceed.  I explained I am completing New OB Intake today. We discussed EDD of 04/05/2024, by Ultrasound. Pt is G1P0. I reviewed her allergies, medications and Medical/Surgical/OB history.    There are no active problems to display for this patient.   Concerns addressed today  Delivery Plans Plans to deliver at [facility] Discussed the nature of our practice with multiple providers including residents and students. Due to the size of the practice, the delivering provider may not be the same as those providing prenatal care.   Patient is not interested in water birth. Offered upcoming OB visit with CNM to discuss further.  MyChart/Babyscripts MyChart access verified. I explained pt will have some visits in office and some virtually. Babyscripts instructions given and order placed. Patient verifies receipt of registration text/e-mail. Account successfully created and app downloaded. If patient is a candidate for Optimized scheduling, add to sticky note.   Blood Pressure Cuff/Weight Scale Blood pressure cuff ordered for patient to pick-up from [employer]. Explained after first prenatal appt pt will check weekly and document in Babyscripts. Patient does have weight scale.  Anatomy US  Explained first scheduled US  will be around 19 weeks. Anatomy US  TBD.  Is patient a CenteringPregnancy candidate?  Declined Declined due to Group  setting Not a candidate due to  If accepted,    Is patient a Mom+Baby Combined Care candidate?  Declined   If accepted, confirm patient does not intend to move from the area for at least 12 months, then notify Mom+Baby staff  Interested in [place]? If yes, send referral and doula dot phrase.   Is patient a candidate for Babyscripts Optimization? Yes, patient declined   First visit review I reviewed new OB appt with patient. Explained pt will be seen by Loralyn Rochester, MD at first visit. Discussed [place] genetic screening with patient. Interested, complete at next visit. [facility] and [facility].. Routine prenatal labs is completed today.   Last Pap No results found for: "DIAGPAP"  Valley Gavia, RN 08/22/2023  9:19 AM

## 2023-08-24 LAB — CULTURE, OB URINE

## 2023-08-24 LAB — URINE CULTURE, OB REFLEX

## 2023-09-06 ENCOUNTER — Encounter: Payer: Self-pay | Admitting: Obstetrics and Gynecology

## 2023-09-07 MED ORDER — DOXYLAMINE-PYRIDOXINE 10-10 MG PO TBEC
2.0000 | DELAYED_RELEASE_TABLET | Freq: Every day | ORAL | 2 refills | Status: DC
Start: 2023-09-07 — End: 2023-12-21

## 2023-09-08 ENCOUNTER — Inpatient Hospital Stay (HOSPITAL_COMMUNITY)
Admission: AD | Admit: 2023-09-08 | Discharge: 2023-09-08 | Disposition: A | Discharge status: Home / Self Care | Attending: Obstetrics & Gynecology | Admitting: Obstetrics & Gynecology

## 2023-09-08 DIAGNOSIS — Z3A10 10 weeks gestation of pregnancy: Secondary | ICD-10-CM

## 2023-09-08 DIAGNOSIS — O219 Vomiting of pregnancy, unspecified: Secondary | ICD-10-CM | POA: Diagnosis present

## 2023-09-08 MED ORDER — LACTATED RINGERS IV BOLUS
1000.0000 mL | Freq: Once | INTRAVENOUS | Status: AC
  Administered 2023-09-08: 1000 mL via INTRAVENOUS

## 2023-09-08 MED ORDER — ONDANSETRON 4 MG PO TBDP: 4.0000 mg | ORAL_TABLET | Freq: Four times a day (QID) | ORAL | 1 refills | Status: DC | PRN

## 2023-09-08 MED ORDER — FAMOTIDINE IN NACL 20-0.9 MG/50ML-% IV SOLN
20.0000 mg | Freq: Once | INTRAVENOUS | Status: AC
  Administered 2023-09-08: 20 mg via INTRAVENOUS
  Filled 2023-09-08: qty 50

## 2023-09-08 MED ORDER — SODIUM CHLORIDE 0.9 % IV SOLN
25.0000 mg | Freq: Once | INTRAVENOUS | Status: AC
  Administered 2023-09-08: 25 mg via INTRAVENOUS
  Filled 2023-09-08: qty 1

## 2023-09-08 NOTE — MAU Note (Signed)
 MAU Triage Note  .Sherry Barton is a 23 y.o. at [redacted]w[redacted]d here in MAU reporting: irritractible nausea and vomiting-unable to keep food or water x 7 days-has prescription for antimetic but states it causes her "body to feel weird"-unsure of name-also has taken promethazine  but reports its ineffective. Phenergan  taken last 1700  Pain score: stomach 9-burning - empty                     Uterine cramping-7-intermittent Vitals:   09/08/23 0134  BP: (!) 143/81  Pulse: 90  Resp: 17  Temp: 99 F (37.2 C)  SpO2: 99%  BP retake first BP after vomiting 128/76    Lab orders placed from triage: see orders

## 2023-09-08 NOTE — MAU Provider Note (Signed)
 History     CSN: [id number]  Arrival date and time: 09/08/23 0011   Event Date/Time   First Provider Initiated Contact with Patient 09/08/23 0154     Sherry Barton is a 23 y.o. G1P0 at [redacted]w[redacted]d who receives care at [facility].  She presents today for nausea and vomiting.  Patient states she has had multiple bouts of vomiting despite phenergan  dosing. She states she has another medication at home that she doesn't take because it "makes me feel weird," but she does not know the name.  Patient denies h/o marijuana usage prior to pregnancy. Patient reports some constipation and reports bowel movement 2 days ago that was hard to pass.  Patient denies issues with urination and no vaginal discharge or bleeding.    Chief Complaint  Patient presents with   Nausea   Emesis    OB History     Gravida  1   Para      Term      Preterm      AB      Living         SAB      IAB      Ectopic      Multiple      Live Births              Past Medical History:  Diagnosis Date   Chlamydia     Past Surgical History:  Procedure Laterality Date   NO PAST SURGERIES      Family History  Problem Relation Age of Onset   Healthy Mother    Healthy Father     Social History   Tobacco Use   Smoking status: Never    Passive exposure: Never   Smokeless tobacco: Never  Vaping Use   Vaping status: Never Used  Substance Use Topics   Alcohol use: Never   Drug use: Not Currently    Types: Marijuana    Comment: been a long time, sometime 2024    Allergies:  Allergies  Allergen Reactions   Compazine  [Prochlorperazine ] Other (See Comments)    Pt states it makes her feel off, and she has weird dreams while taking    Medications Prior to Admission  Medication Sig Dispense Refill Last Dose/Taking   Blood Pressure Monitoring (BLOOD PRESSURE KIT) DEVI 1 kit by Does not apply route once a week. 1 each 0    Doxylamine-Pyridoxine (DICLEGIS) 10-10 MG TBEC Take 2 tablets by mouth  at bedtime. May add 1 tablet at Breakfast and 1 Tablet at Lunch if needed 100 tablet 2    metroNIDAZOLE  (FLAGYL ) 500 MG tablet Take 1 tablet (500 mg total) by mouth 2 (two) times daily. 14 tablet 0    Prenatal Vit-Fe Fumarate-FA (MULTIVITAMIN-PRENATAL) 27-0.8 MG TABS tablet Take 1 tablet by mouth daily at 12 noon.      promethazine  (PHENERGAN ) 25 MG tablet Take 1 tablet (25 mg total) by mouth every 6 (six) hours as needed for nausea or vomiting. 30 tablet 1     Review of Systems  Constitutional:  Negative for chills and fever.  Gastrointestinal:  Positive for nausea and vomiting. Negative for abdominal pain.  Genitourinary:  Negative for vaginal bleeding and vaginal discharge.  Neurological:  Negative for dizziness, light-headedness and headaches.   Physical Exam   Blood pressure (!) 143/81, pulse 90, temperature 99 F (37.2 C), temperature source Oral, resp. rate 17, height 5\' 3"  (1.6 m), weight 44.1 kg, last menstrual period 06/21/2023, SpO2 99%.  Physical Exam Vitals and nursing note reviewed. Exam conducted with a chaperone present (Dea, Charity fundraiser).  Constitutional:      Appearance: Normal appearance.  HENT:     Head: Normocephalic and atraumatic.  Eyes:     Conjunctiva/sclera: Conjunctivae normal.  Cardiovascular:     Rate and Rhythm: Normal rate.  Pulmonary:     Effort: Pulmonary effort is normal. No respiratory distress.  Musculoskeletal:     Cervical back: Normal range of motion.  Skin:    General: Skin is warm and dry.  Neurological:     Mental Status: She is alert and oriented to person, place, and time.  Psychiatric:        Mood and Affect: Mood normal.        Behavior: Behavior normal.     MAU Course  Procedures No results found for this or any previous visit (from the past 24 hours).  MDM Start IV LR Bolus Antiemetic PPI Assessment and Plan  23 year old, G1P0  SIUP at 10 weeks Nausea and Vomiting  -Collect labs as ordered. -Start IV with LR Bolus -Give  Phenergan  IV. -Give Pepcid . -POC discussed with patient who is agreeable. -Discussed sending home with oral medications for management. -Patient has prescription for Diclegis, encouraged to pick -No questions or concerns. -Will observe and reassess for readiness of PO challenge.   Kraig Peru 09/08/2023, 1:54 AM   Reassessment (5:27 AM) -Patient reports improvement in symptoms per nurse.  -Nurse to give precautions. -Will send in scrip for Zofran  4mg  ODT for usage.  -Encouraged to call primary office or return to MAU if symptoms worsen or with the onset of new symptoms. -Discharged to home in improved condition.  Kraig Peru MSN, CNM Advanced Practice Provider, Center for [employer]

## 2023-09-08 NOTE — Discharge Instructions (Signed)

## 2023-09-17 ENCOUNTER — Ambulatory Visit: Admitting: Obstetrics and Gynecology

## 2023-09-17 VITALS — BP 107/68 | HR 91 | Wt 100.0 lb

## 2023-09-17 DIAGNOSIS — Z3481 Encounter for supervision of other normal pregnancy, first trimester: Secondary | ICD-10-CM

## 2023-09-17 DIAGNOSIS — Z3401 Encounter for supervision of normal first pregnancy, first trimester: Secondary | ICD-10-CM | POA: Diagnosis not present

## 2023-09-17 DIAGNOSIS — Z3480 Encounter for supervision of other normal pregnancy, unspecified trimester: Secondary | ICD-10-CM

## 2023-09-17 DIAGNOSIS — Z3A11 11 weeks gestation of pregnancy: Secondary | ICD-10-CM | POA: Diagnosis not present

## 2023-09-17 NOTE — Addendum Note (Signed)
 Addended by: Loralyn Rochester on: 09/17/2023 04:50 PM   Modules accepted: Orders

## 2023-09-17 NOTE — Addendum Note (Signed)
 Addended by: Ina Manas on: 09/17/2023 02:48 PM   Modules accepted: Orders

## 2023-09-17 NOTE — Progress Notes (Signed)
 Pt presents for New OB visit. No concerns.

## 2023-09-17 NOTE — Progress Notes (Signed)
 Subjective:   Sherry Barton is a 23 y.o. G1P0 at [redacted]w[redacted]d by 6wk US  being seen today for her first obstetrical visit.  Her obstetrical history is significant for none. Patient does intend to breast feed. Pregnancy history fully reviewed - G1.  Patient reports nausea. Improved with zofran  & phenergan . Has not tried diclegis .  HISTORY: OB History  Gravida Para Term Preterm AB Living  1 0 0 0 0 0  SAB IAB Ectopic Multiple Live Births  0 0 0 0 0    # Outcome Date GA Lbr Len/2nd Weight Sex Type Anes PTL Lv  1 Current              Last pap smear: No results found for: "DIAGPAP", "HPV", "HPVHIGH" N/a - age < 68, will need PP  Past Medical History:  Diagnosis Date   Chlamydia    Past Surgical History:  Procedure Laterality Date   NO PAST SURGERIES     Family History  Problem Relation Age of Onset   Healthy Mother    Healthy Father    Social History   Tobacco Use   Smoking status: Never    Passive exposure: Never   Smokeless tobacco: Never  Vaping Use   Vaping status: Never Used  Substance Use Topics   Alcohol use: Never   Drug use: Not Currently    Types: Marijuana    Comment: been a long time, sometime 2024   Allergies  Allergen Reactions   Compazine  [Prochlorperazine ] Other (See Comments)    Pt states it makes her feel off, and she has weird dreams while taking   Current Outpatient Medications on File Prior to Visit  Medication Sig Dispense Refill   Blood Pressure Monitoring (BLOOD PRESSURE KIT) DEVI 1 kit by Does not apply route once a week. 1 each 0   ondansetron  (ZOFRAN -ODT) 4 MG disintegrating tablet Take 1-2 tablets (4-8 mg total) by mouth every 6 (six) hours as needed for nausea or vomiting. 30 tablet 1   Prenatal Vit-Fe Fumarate-FA (MULTIVITAMIN-PRENATAL) 27-0.8 MG TABS tablet Take 1 tablet by mouth daily at 12 noon.     Doxylamine -Pyridoxine  (DICLEGIS ) 10-10 MG TBEC Take 2 tablets by mouth at bedtime. May add 1 tablet at Breakfast and 1 Tablet at  Lunch if needed (Patient not taking: Reported on 09/17/2023) 100 tablet 2   promethazine  (PHENERGAN ) 25 MG tablet Take 1 tablet (25 mg total) by mouth every 6 (six) hours as needed for nausea or vomiting. (Patient not taking: Reported on 09/17/2023) 30 tablet 1   No current facility-administered medications on file prior to visit.   Exam   Vitals:   09/17/23 1353  BP: 107/68  Pulse: 91  Weight: 100 lb (45.4 kg)   Fetal Heart Rate (bpm): 164  General:  Alert, oriented and cooperative. Patient is in no acute distress.  Breast: Deferred  Cardiovascular: Normal heart rate noted  Respiratory: Normal respiratory effort, no problems with respiration noted  Abdomen: Soft, non tender  Pelvic:  Deferred  Extremities: Normal range of motion.  Edema: None  Mental Status: Normal mood and affect. Normal behavior. Normal judgment and thought content.    Assessment:   Pregnancy: G1P0 Patient Active Problem List   Diagnosis Date Noted   Supervision of other normal pregnancy, antepartum 08/22/2023   Plan:  1. Supervision of other normal pregnancy, antepartum (Primary) 2. [redacted] weeks gestation of pregnancy Initial labs drawn. Continue prenatal vitamins. Genetic Screening discussed: NIPS, carrier screening and AFP, ordered.  Ultrasound discussed; fetal anatomic survey: ordered. Problem list reviewed and updated. The nature of [facility] with multiple MDs and other Advanced Practice Providers was explained to patient; also emphasized that residents, students are part of our team. Routine obstetric precautions reviewed. - Hemoglobin A1c - Type and screen; Future - Rubella screen - PANORAMA PRENATAL TEST - HORIZON Basic Panel  3. Nausea/vomiting of pregnancy Tolerating PO Pt would like to try diclegis , has active rx  Return in about 5 weeks (around 10/22/2023) for return OB at 16 weeks.  Loralyn Rochester, MD Obstetrician & Gynecologist, [facility] for [employer], [facility]

## 2023-09-18 LAB — ANTIBODY SCREEN: Antibody Screen: NEGATIVE

## 2023-09-19 LAB — HEMOGLOBIN A1C
Est. average glucose Bld gHb Est-mCnc: 111 mg/dL
Hgb A1c MFr Bld: 5.5 % (ref 4.8–5.6)

## 2023-09-19 LAB — RUBELLA SCREEN: Rubella Antibodies, IGG: 4.61 {index} (ref >0.99)

## 2023-09-20 ENCOUNTER — Ambulatory Visit: Payer: Self-pay | Admitting: Obstetrics and Gynecology

## 2023-09-20 DIAGNOSIS — D563 Thalassemia minor: Secondary | ICD-10-CM

## 2023-09-20 DIAGNOSIS — Z3480 Encounter for supervision of other normal pregnancy, unspecified trimester: Secondary | ICD-10-CM

## 2023-09-21 LAB — PANORAMA PRENATAL TEST FULL PANEL:PANORAMA TEST PLUS 5 ADDITIONAL MICRODELETIONS: FETAL FRACTION: 17.9

## 2023-09-26 LAB — HORIZON CUSTOM: REPORT SUMMARY: POSITIVE — AB

## 2023-09-27 DIAGNOSIS — D563 Thalassemia minor: Secondary | ICD-10-CM | POA: Insufficient documentation

## 2023-10-09 ENCOUNTER — Inpatient Hospital Stay (HOSPITAL_COMMUNITY)
Admission: AD | Admit: 2023-10-09 | Discharge: 2023-10-09 | Disposition: A | Discharge status: Home / Self Care | Attending: Obstetrics & Gynecology | Admitting: Obstetrics & Gynecology

## 2023-10-09 ENCOUNTER — Encounter (HOSPITAL_COMMUNITY): Payer: Self-pay | Admitting: Obstetrics and Gynecology

## 2023-10-09 DIAGNOSIS — E876 Hypokalemia: Secondary | ICD-10-CM

## 2023-10-09 DIAGNOSIS — Z3A14 14 weeks gestation of pregnancy: Secondary | ICD-10-CM | POA: Diagnosis not present

## 2023-10-09 DIAGNOSIS — O211 Hyperemesis gravidarum with metabolic disturbance: Secondary | ICD-10-CM | POA: Insufficient documentation

## 2023-10-09 DIAGNOSIS — O210 Mild hyperemesis gravidarum: Secondary | ICD-10-CM

## 2023-10-09 DIAGNOSIS — E860 Dehydration: Secondary | ICD-10-CM | POA: Diagnosis not present

## 2023-10-09 DIAGNOSIS — O99282 Endocrine, nutritional and metabolic diseases complicating pregnancy, second trimester: Secondary | ICD-10-CM | POA: Diagnosis not present

## 2023-10-09 DIAGNOSIS — O219 Vomiting of pregnancy, unspecified: Secondary | ICD-10-CM | POA: Diagnosis present

## 2023-10-09 LAB — URINALYSIS, ROUTINE W REFLEX MICROSCOPIC
Bilirubin Urine: NEGATIVE
Glucose, UA: NEGATIVE mg/dL
Hgb urine dipstick: NEGATIVE
Ketones, ur: 80 mg/dL — AB
Leukocytes,Ua: NEGATIVE
Nitrite: NEGATIVE
Protein, ur: 100 mg/dL — AB
Specific Gravity, Urine: 1.03 (ref 1.005–1.030)
pH: 5 (ref 5.0–8.0)

## 2023-10-09 LAB — CBC
HCT: 35 % — ABNORMAL LOW (ref 36.0–46.0)
Hemoglobin: 11.4 g/dL — ABNORMAL LOW (ref 12.0–15.0)
MCH: 25.9 pg — ABNORMAL LOW (ref 26.0–34.0)
MCHC: 32.6 g/dL (ref 30.0–36.0)
MCV: 79.4 fL — ABNORMAL LOW (ref 80.0–100.0)
Platelets: 322 10*3/uL (ref 150–400)
RBC: 4.41 MIL/uL (ref 3.87–5.11)
RDW: 15.1 % (ref 11.5–15.5)
WBC: 6.6 10*3/uL (ref 4.0–10.5)
nRBC: 0 % (ref 0.0–0.2)

## 2023-10-09 LAB — COMPREHENSIVE METABOLIC PANEL WITH GFR
ALT: 12 U/L (ref 0–44)
AST: 19 U/L (ref 15–41)
Albumin: 3.8 g/dL (ref 3.5–5.0)
Alkaline Phosphatase: 30 U/L — ABNORMAL LOW (ref 38–126)
Anion gap: 11 (ref 5–15)
BUN: 9 mg/dL (ref 6–20)
CO2: 24 mmol/L (ref 22–32)
Calcium: 9.6 mg/dL (ref 8.9–10.3)
Chloride: 101 mmol/L (ref 98–111)
Creatinine, Ser: 0.57 mg/dL (ref 0.44–1.00)
GFR, Estimated: >60 mL/min (ref >60)
Glucose, Bld: 95 mg/dL (ref 70–99)
Potassium: 3.3 mmol/L — ABNORMAL LOW (ref 3.5–5.1)
Sodium: 136 mmol/L (ref 135–145)
Total Bilirubin: 0.8 mg/dL (ref 0.0–1.2)
Total Protein: 7.7 g/dL (ref 6.5–8.1)

## 2023-10-09 MED ORDER — POTASSIUM CHLORIDE CRYS ER 20 MEQ PO TBCR: 20.0000 meq | EXTENDED_RELEASE_TABLET | Freq: Two times a day (BID) | ORAL | 0 refills | Status: DC

## 2023-10-09 MED ORDER — SCOPOLAMINE 1 MG/3DAYS TD PT72: 1.0000 | MEDICATED_PATCH | TRANSDERMAL | 12 refills | Status: DC

## 2023-10-09 MED ORDER — ONDANSETRON HCL 4 MG/2ML IJ SOLN
4.0000 mg | Freq: Once | INTRAMUSCULAR | Status: AC
Start: 2023-10-09 — End: 2023-10-09
  Administered 2023-10-09: 4 mg via INTRAVENOUS
  Filled 2023-10-09: qty 2

## 2023-10-09 MED ORDER — LACTATED RINGERS IV BOLUS
1000.0000 mL | Freq: Once | INTRAVENOUS | Status: AC
  Administered 2023-10-09: 1000 mL via INTRAVENOUS

## 2023-10-09 MED ORDER — SCOPOLAMINE 1 MG/3DAYS TD PT72
1.0000 | MEDICATED_PATCH | TRANSDERMAL | Status: DC
  Administered 2023-10-09: 1.5 mg via TRANSDERMAL
  Filled 2023-10-09: qty 1

## 2023-10-09 NOTE — MAU Provider Note (Signed)
 Chief Complaint:  Nausea and Emesis   HPI    Sherry Barton is a 23 y.o. G1P0 at [redacted]w[redacted]d who presents to maternity admissions reporting inability to tolerate PO Food or Fluid x 3 days. She reports that the medications she has at home are no longer working for her. On records review she has phenergan , Zofran , Compazine  and Diclegis    She denies any VB, LOF, Cramping   Pregnancy Course: [facility] ( Pregnancy records reviewed)  Past Medical History:  Diagnosis Date   Chlamydia    OB History  Gravida Para Term Preterm AB Living  1       SAB IAB Ectopic Multiple Live Births          # Outcome Date GA Lbr Len/2nd Weight Sex Type Anes PTL Lv  1 Current            Past Surgical History:  Procedure Laterality Date   NO PAST SURGERIES     Family History  Problem Relation Age of Onset   Healthy Mother    Healthy Father    Social History   Tobacco Use   Smoking status: Never    Passive exposure: Never   Smokeless tobacco: Never  Vaping Use   Vaping status: Never Used  Substance Use Topics   Alcohol use: Never   Drug use: Not Currently    Types: Marijuana    Comment: been a long time, sometime 2024   Allergies  Allergen Reactions   Compazine  [Prochlorperazine ] Other (See Comments)    Pt states it makes her feel off, and she has weird dreams while taking   Medications Prior to Admission  Medication Sig Dispense Refill Last Dose/Taking   Doxylamine -Pyridoxine  (DICLEGIS ) 10-10 MG TBEC Take 2 tablets by mouth at bedtime. May add 1 tablet at Breakfast and 1 Tablet at Lunch if needed 100 tablet 2 10/08/2023 Bedtime   Blood Pressure Monitoring (BLOOD PRESSURE KIT) DEVI 1 kit by Does not apply route once a week. 1 each 0    ondansetron  (ZOFRAN -ODT) 4 MG disintegrating tablet Take 1-2 tablets (4-8 mg total) by mouth every 6 (six) hours as needed for nausea or vomiting. 30 tablet 1    Prenatal Vit-Fe Fumarate-FA (MULTIVITAMIN-PRENATAL) 27-0.8 MG TABS tablet Take 1 tablet by mouth daily  at 12 noon.      promethazine  (PHENERGAN ) 25 MG tablet Take 1 tablet (25 mg total) by mouth every 6 (six) hours as needed for nausea or vomiting. (Patient not taking: Reported on 09/17/2023) 30 tablet 1     I have reviewed patient's Past Medical Hx, Surgical Hx, Family Hx, Social Hx, medications and allergies.   ROS  Pertinent items noted in HPI and remainder of comprehensive ROS otherwise negative.   PHYSICAL EXAM   Patient Vitals for the past 24 hrs:  BP Temp Pulse Resp SpO2 Height Weight  10/09/23 1544 102/66 -- 83 -- 100 % -- --  10/09/23 1437 124/73 98.3 F (36.8 C) 85 18 -- 5\' 3"  (1.6 m) 45.4 kg    Constitutional: Well-developed, thin female in no acute distress but appears pale and weak Cardiovascular: normal rate & rhythm, warm and well-perfused Respiratory: normal effort, no problems with respiration noted GI: Abd soft, non-tender MS: Extremities nontender, no edema, normal ROM Neurologic: Alert and oriented x 4.  GU: no CVA tenderness Pelvic: deferred      Fetal Tracing: 152 via Doppler      Labs: Results for orders placed or performed during the hospital encounter of  10/09/23 (from the past 24 hours)  Urinalysis, Routine w reflex microscopic -Urine, Clean Catch     Status: Abnormal   Collection Time: 10/09/23  3:05 PM  Result Value Ref Range   Color, Urine AMBER (A) YELLOW   APPearance HAZY (A) CLEAR   Specific Gravity, Urine 1.030 1.005 - 1.030   pH 5.0 5.0 - 8.0   Glucose, UA NEGATIVE NEGATIVE mg/dL   Hgb urine dipstick NEGATIVE NEGATIVE   Bilirubin Urine NEGATIVE NEGATIVE   Ketones, ur 80 (A) NEGATIVE mg/dL   Protein, ur 540 (A) NEGATIVE mg/dL   Nitrite NEGATIVE NEGATIVE   Leukocytes,Ua NEGATIVE NEGATIVE   RBC / HPF 0-5 0 - 5 RBC/hpf   WBC, UA 0-5 0 - 5 WBC/hpf   Bacteria, UA RARE (A) NONE SEEN   Squamous Epithelial / HPF 6-10 0 - 5 /HPF   Mucus PRESENT   CBC     Status: Abnormal   Collection Time: 10/09/23  3:55 PM  Result Value Ref Range   WBC 6.6  4.0 - 10.5 K/uL   RBC 4.41 3.87 - 5.11 MIL/uL   Hemoglobin 11.4 (L) 12.0 - 15.0 g/dL   HCT 98.1 (L) 19.1 - 47.8 %   MCV 79.4 (L) 80.0 - 100.0 fL   MCH 25.9 (L) 26.0 - 34.0 pg   MCHC 32.6 30.0 - 36.0 g/dL   RDW 29.5 62.1 - 30.8 %   Platelets 322 150 - 400 K/uL   nRBC 0.0 0.0 - 0.2 %  Comprehensive metabolic panel     Status: Abnormal   Collection Time: 10/09/23  3:55 PM  Result Value Ref Range   Sodium 136 135 - 145 mmol/L   Potassium 3.3 (L) 3.5 - 5.1 mmol/L   Chloride 101 98 - 111 mmol/L   CO2 24 22 - 32 mmol/L   Glucose, Bld 95 70 - 99 mg/dL   BUN 9 6 - 20 mg/dL   Creatinine, Ser 6.57 0.44 - 1.00 mg/dL   Calcium 9.6 8.9 - 84.6 mg/dL   Total Protein 7.7 6.5 - 8.1 g/dL   Albumin 3.8 3.5 - 5.0 g/dL   AST 19 15 - 41 U/L   ALT 12 0 - 44 U/L   Alkaline Phosphatase 30 (L) 38 - 126 U/L   Total Bilirubin 0.8 0.0 - 1.2 mg/dL   GFR, Estimated >96 >29 mL/min   Anion gap 11 5 - 15    Imaging:  No results found.  MDM & MAU COURSE  MDM:  HIGH  N/V in pregnancy with likely dehydration CBC, CMP  r./o electrolyte abnormalities U/A : concern for dehydration  IVF : Concern for dehydration/ rehydration IV antiemetics ordered  Scopamine patch ordered since failed other antiemetics given previously  Reassessed @1615  U/A confirms dehydration will order another LR Bolus for rehydration  Has scopamine patch on and noted some relief with IV antiemetics and the patch - plan for discharge after IVF completed/ and remaining labs result   @1744   Patient tolerating PO Challenge CMP c/w hypokalemia ( Will plan to discharge with Potassium supplement)   MAU Course: Orders Placed This Encounter  Procedures   Urinalysis, Routine w reflex microscopic -Urine, Clean Catch   CBC   Comprehensive metabolic panel   Discharge patient Discharge disposition: 01-Home or Self Care; Discharge patient date: 10/09/2023   Meds ordered this encounter  Medications   lactated ringers  bolus 1,000 mL    ondansetron  (ZOFRAN ) injection 4 mg   scopolamine (TRANSDERM-SCOP) 1 MG/3DAYS 1.5  mg   lactated ringers  bolus 1,000 mL   scopolamine (TRANSDERM-SCOP) 1 MG/3DAYS    Sig: Place 1 patch (1.5 mg total) onto the skin every 3 (three) days.    Dispense:  10 patch    Refill:  12    Supervising Provider:   PRATT, TANYA S [[id number]]   potassium chloride SA (KLOR-CON M) 20 MEQ tablet    Sig: Take 1 tablet (20 mEq total) by mouth 2 (two) times daily for 2 days.    Dispense:  4 tablet    Refill:  0    Supervising Provider:   PRATT, TANYA S [[id number]]     I have reviewed the patient chart and performed the physical exam . I have ordered & interpreted the lab results and reviewed them with the patient  Medications ordered as stated below.  A/P as described below.  Counseling and education provided and patient agreeable  with plan as described below. Verbalized understanding.    ASSESSMENT   1. Hyperemesis affecting pregnancy, antepartum   2. [redacted] weeks gestation of pregnancy   3. Dehydration during pregnancy   4. Hypokalemia      PLAN  Discharge home in stable condition with return precautions.   See AVS for full description of information given to the patient including both verbal and written. Patient verbalized understanding and agrees with the plan as described above.     Follow-up Information     [facility] for [facility] Follow up.   Specialty: Obstetrics and Gynecology Why: If symptoms worsen or fail to resolve, As scheduled for ongoing prenatal care Contact information: [address] [place]  [address] [phone]                Allergies as of 10/09/2023       Reactions   Compazine  [prochlorperazine ] Other (See Comments)   Pt states it makes her feel off, and she has weird dreams while taking        Medication List     TAKE these medications    Blood Pressure Kit Devi 1 kit by Does not apply route once a  week.   Doxylamine -Pyridoxine  10-10 MG Tbec Commonly known as: Diclegis  Take 2 tablets by mouth at bedtime. May add 1 tablet at Breakfast and 1 Tablet at Lunch if needed   multivitamin-prenatal 27-0.8 MG Tabs tablet Take 1 tablet by mouth daily at 12 noon.   ondansetron  4 MG disintegrating tablet Commonly known as: ZOFRAN -ODT Take 1-2 tablets (4-8 mg total) by mouth every 6 (six) hours as needed for nausea or vomiting.   potassium chloride SA 20 MEQ tablet Commonly known as: KLOR-CON M Take 1 tablet (20 mEq total) by mouth 2 (two) times daily for 2 days.   promethazine  25 MG tablet Commonly known as: PHENERGAN  Take 1 tablet (25 mg total) by mouth every 6 (six) hours as needed for nausea or vomiting.   scopolamine 1 MG/3DAYS Commonly known as: TRANSDERM-SCOP Place 1 patch (1.5 mg total) onto the skin every 3 (three) days. Start taking on: October 12, 2023        Debbe Fail, MSN, [facility], Center for [employer]

## 2023-10-09 NOTE — MAU Note (Signed)
 Sherry Barton is a 23 y.o. at [redacted]w[redacted]d here in MAU reporting: has not been able to keep anything down x 3 days. Has digleges but not helping .  LMP:  Onset of complaint: 3 days Pain score: 10 Vitals:   10/09/23 1437  BP: 124/73  Pulse: 85  Resp: 18  Temp: 98.3 F (36.8 C)     FHT: 152   Lab orders placed from triage: U/A

## 2023-10-23 ENCOUNTER — Ambulatory Visit (INDEPENDENT_AMBULATORY_CARE_PROVIDER_SITE_OTHER): Admitting: Advanced Practice Midwife

## 2023-10-23 ENCOUNTER — Other Ambulatory Visit: Payer: Self-pay | Admitting: Advanced Practice Midwife

## 2023-10-23 VITALS — BP 98/66 | HR 74 | Wt 103.7 lb

## 2023-10-23 DIAGNOSIS — Z3A16 16 weeks gestation of pregnancy: Secondary | ICD-10-CM

## 2023-10-23 DIAGNOSIS — Z3480 Encounter for supervision of other normal pregnancy, unspecified trimester: Secondary | ICD-10-CM

## 2023-10-23 DIAGNOSIS — D563 Thalassemia minor: Secondary | ICD-10-CM

## 2023-10-23 DIAGNOSIS — O210 Mild hyperemesis gravidarum: Secondary | ICD-10-CM | POA: Diagnosis not present

## 2023-10-23 NOTE — Progress Notes (Signed)
 Pt presents for ROB. No questions or concerns for today's visit. Unable to log BPs into baby scripts d/t getting a new phone.

## 2023-10-23 NOTE — Progress Notes (Signed)
   PRENATAL VISIT NOTE  Subjective:  Sherry Barton is a 23 y.o. G1P0 at [redacted]w[redacted]d being seen today for ongoing prenatal care.  She is currently monitored for the following issues for this low-risk pregnancy and has Supervision of other normal pregnancy, antepartum and Alpha thalassemia silent carrier on their problem list.  Patient reports no complaints.  Contractions: Not present. Vag. Bleeding: None.  Movement: Absent. Denies leaking of fluid.   The following portions of the patient's history were reviewed and updated as appropriate: allergies, current medications, past family history, past medical history, past social history, past surgical history and problem list.   Objective:    Vitals:   10/23/23 1433  BP: 98/66  Pulse: 74  Weight: 103 lb 11.2 oz (47 kg)    Fetal Status:  Fetal Heart Rate (bpm): 146   Movement: Absent    General: Alert, oriented and cooperative. Patient is in no acute distress.  Skin: Skin is warm and dry. No rash noted.   Cardiovascular: Normal heart rate noted  Respiratory: Normal respiratory effort, no problems with respiration noted  Abdomen: Soft, gravid, appropriate for gestational age.  Pain/Pressure: Absent     Pelvic: Cervical exam deferred        Extremities: Normal range of motion.  Edema: None  Mental Status: Normal mood and affect. Normal behavior. Normal judgment and thought content.   Assessment and Plan:  Pregnancy: G1P0 at [redacted]w[redacted]d 1. Supervision of other normal pregnancy, antepartum (Primary) --Anticipatory guidance about next visits/weeks of pregnancy given.  --Pt defers AFP until next visit - Ambulatory Referral to [facility] -Anatomy US  ordered, not scheduled. Message sent to MFM to schedule.  2. Hyperemesis affecting pregnancy, antepartum --N/V much better. Pt not needing meds for nausea now.  3. [redacted] weeks gestation of pregnancy   Preterm labor symptoms and general obstetric precautions including but not limited to vaginal  bleeding, contractions, leaking of fluid and fetal movement were reviewed in detail with the patient. Please refer to After Visit Summary for other counseling recommendations.   Return in about 4 weeks (around 11/20/2023) for LOB.  Future Appointments  Date Time Provider Department Center  11/20/2023  2:30 PM Leftwich-Kirby, Darren Em, CNM [facility] None    Arlester Bence, CNM

## 2023-11-20 ENCOUNTER — Encounter: Payer: Self-pay | Admitting: Advanced Practice Midwife

## 2023-11-20 ENCOUNTER — Ambulatory Visit: Admitting: Advanced Practice Midwife

## 2023-11-20 VITALS — BP 131/85 | HR 91 | Wt 103.0 lb

## 2023-11-20 DIAGNOSIS — D563 Thalassemia minor: Secondary | ICD-10-CM | POA: Diagnosis not present

## 2023-11-20 DIAGNOSIS — O210 Mild hyperemesis gravidarum: Secondary | ICD-10-CM | POA: Diagnosis not present

## 2023-11-20 DIAGNOSIS — Z3480 Encounter for supervision of other normal pregnancy, unspecified trimester: Secondary | ICD-10-CM | POA: Diagnosis not present

## 2023-11-20 NOTE — Progress Notes (Signed)
 Was experiencing nausea/ vomiting Sunday and Monday, not present today. Had some cramping/irritability on Sunday.   Silent carrier for Alpha- Thalassemia. Asking about partner testing.   Interested in water birth.

## 2023-11-20 NOTE — Progress Notes (Addendum)
   PRENATAL VISIT NOTE  Subjective:  Sherry Barton is a 23 y.o. G1P0 at [redacted]w[redacted]d being seen today for ongoing prenatal care.  She is currently monitored for the following issues for this low-risk pregnancy and has Supervision of other normal pregnancy, antepartum and Alpha thalassemia silent carrier on their problem list.  Patient reports nausea.  Contractions: Irritability. Vag. Bleeding: None.  Movement: Increased. Denies leaking of fluid.   The following portions of the patient's history were reviewed and updated as appropriate: allergies, current medications, past family history, past medical history, past social history, past surgical history and problem list.   Objective:    Vitals:   11/20/23 1456  BP: 131/85  Pulse: 91  Weight: 103 lb (46.7 kg)    Fetal Status:  Fetal Heart Rate (bpm): 154 Fundal Height: 20 cm Movement: Increased    General: Alert, oriented and cooperative. Patient is in no acute distress.  Skin: Skin is warm and dry. No rash noted.   Cardiovascular: Normal heart rate noted  Respiratory: Normal respiratory effort, no problems with respiration noted  Abdomen: Soft, gravid, appropriate for gestational age.  Pain/Pressure: Absent     Pelvic: Cervical exam deferred        Extremities: Normal range of motion.  Edema: None  Mental Status: Normal mood and affect. Normal behavior. Normal judgment and thought content.   Assessment and Plan:  Pregnancy: G1P0 at [redacted]w[redacted]d 1. Supervision of other normal pregnancy, antepartum (Primary) --Anticipatory guidance about next visits/weeks of pregnancy given.  --Anatomy US  7/29 - Pt is interested in waterbirth.  No contraindications at this time per chart review/patient assessment.   - Pt to enroll in class, see CNMs for most visits in the office.  - Discussed waterbirth as option for low-risk pregnancy.  Reviewed conditions that may arise during pregnancy that will risk pt out of waterbirth including hypertension, diabetes,  fetal growth restriction <10%ile, etc.   - AFP, Serum, Open Spina Bifida  2. Alpha thalassemia silent carrier --Partner kit given  3. Hyperemesis affecting pregnancy, antepartum --N/V had improved but pt had episode of vomiting 2 days ago, resolved today.   --Encouraged PO fluids, reviewed reasons to seek care  Preterm labor symptoms and general obstetric precautions including but not limited to vaginal bleeding, contractions, leaking of fluid and fetal movement were reviewed in detail with the patient. Please refer to After Visit Summary for other counseling recommendations.   Return in about 4 weeks (around 12/18/2023) for Midwife preferred.  Future Appointments  Date Time Provider Department Center  12/04/2023  9:00 AM Operating Room Services PROVIDER 1 [facility]  12/04/2023  9:30 AM [facility] US4 [facility]  12/18/2023  2:30 PM Leftwich-Kirby, Olam LABOR, CNM [facility] None    Olam Boards, CNM

## 2023-11-22 LAB — AFP, SERUM, OPEN SPINA BIFIDA
AFP MoM: 1.29
AFP Value: 104.7 ng/mL
Gest. Age on Collection Date: 20 wk
Maternal Age At EDD: 21 a
OSBR Risk 1 IN: 9894
Test Results:: NEGATIVE
Weight: 103 [lb_av]

## 2023-11-23 ENCOUNTER — Ambulatory Visit: Payer: Self-pay | Admitting: Advanced Practice Midwife

## 2023-12-04 ENCOUNTER — Ambulatory Visit: Payer: Self-pay | Admitting: Advanced Practice Midwife

## 2023-12-04 ENCOUNTER — Other Ambulatory Visit: Payer: Self-pay | Admitting: *Deleted

## 2023-12-04 ENCOUNTER — Ambulatory Visit (HOSPITAL_BASED_OUTPATIENT_CLINIC_OR_DEPARTMENT_OTHER): Attending: Advanced Practice Midwife | Admitting: Obstetrics and Gynecology

## 2023-12-04 ENCOUNTER — Ambulatory Visit

## 2023-12-04 ENCOUNTER — Encounter: Payer: Self-pay | Admitting: Obstetrics and Gynecology

## 2023-12-04 VITALS — BP 119/66 | HR 84

## 2023-12-04 DIAGNOSIS — O99212 Obesity complicating pregnancy, second trimester: Secondary | ICD-10-CM

## 2023-12-04 DIAGNOSIS — Z3480 Encounter for supervision of other normal pregnancy, unspecified trimester: Secondary | ICD-10-CM | POA: Diagnosis not present

## 2023-12-04 DIAGNOSIS — Z363 Encounter for antenatal screening for malformations: Secondary | ICD-10-CM | POA: Diagnosis present

## 2023-12-04 DIAGNOSIS — D563 Thalassemia minor: Secondary | ICD-10-CM | POA: Diagnosis not present

## 2023-12-04 DIAGNOSIS — Z148 Genetic carrier of other disease: Secondary | ICD-10-CM | POA: Insufficient documentation

## 2023-12-04 DIAGNOSIS — Z3A22 22 weeks gestation of pregnancy: Secondary | ICD-10-CM | POA: Insufficient documentation

## 2023-12-04 DIAGNOSIS — Z681 Body mass index (BMI) 19 or less, adult: Secondary | ICD-10-CM

## 2023-12-06 ENCOUNTER — Other Ambulatory Visit: Payer: Self-pay

## 2023-12-06 ENCOUNTER — Inpatient Hospital Stay (HOSPITAL_COMMUNITY)
Admission: AD | Admit: 2023-12-06 | Discharge: 2023-12-06 | Disposition: A | Discharge status: Home / Self Care | Attending: Obstetrics & Gynecology | Admitting: Obstetrics & Gynecology

## 2023-12-06 DIAGNOSIS — Z3A22 22 weeks gestation of pregnancy: Secondary | ICD-10-CM

## 2023-12-06 DIAGNOSIS — B9789 Other viral agents as the cause of diseases classified elsewhere: Secondary | ICD-10-CM | POA: Insufficient documentation

## 2023-12-06 DIAGNOSIS — O26892 Other specified pregnancy related conditions, second trimester: Secondary | ICD-10-CM

## 2023-12-06 DIAGNOSIS — J069 Acute upper respiratory infection, unspecified: Secondary | ICD-10-CM | POA: Diagnosis not present

## 2023-12-06 DIAGNOSIS — R61 Generalized hyperhidrosis: Secondary | ICD-10-CM | POA: Insufficient documentation

## 2023-12-06 DIAGNOSIS — O98512 Other viral diseases complicating pregnancy, second trimester: Secondary | ICD-10-CM | POA: Diagnosis present

## 2023-12-06 DIAGNOSIS — Z79899 Other long term (current) drug therapy: Secondary | ICD-10-CM | POA: Insufficient documentation

## 2023-12-06 DIAGNOSIS — Z3A20 20 weeks gestation of pregnancy: Secondary | ICD-10-CM | POA: Diagnosis not present

## 2023-12-06 DIAGNOSIS — Z3480 Encounter for supervision of other normal pregnancy, unspecified trimester: Secondary | ICD-10-CM

## 2023-12-06 LAB — RESP PANEL BY RT-PCR (RSV, FLU A&B, COVID)  RVPGX2
Influenza A by PCR: NEGATIVE
Influenza B by PCR: NEGATIVE
Resp Syncytial Virus by PCR: NEGATIVE
SARS Coronavirus 2 by RT PCR: NEGATIVE

## 2023-12-06 NOTE — Discharge Instructions (Addendum)
 It was a pleasure seeing you today!   You may drink hot tea with honey, that can improve sore throat.  You can take Zyrtec for for runny nose, sneezing, or feeling stuffy.  Tylenol  is good for muscle pain or feeling feverish.  We will give you a full list of medications safe for you to take in pregnancy.  Continue to wear a mask until you feel better.  Safe Medications in Pregnancy   Acne:  Benzoyl Peroxide  Salicylic Acid   Backache/Headache:  Tylenol : 2 regular strength every 4 hours OR               2 Extra strength every 6 hours   Colds/Coughs/Allergies:  Benadryl  (alcohol free) 25 mg every 6 hours as needed  Breath right strips  Claritin  Cepacol throat lozenges  Chloraseptic throat spray  Cold-Eeze- up to three times per day  Cough drops, alcohol free  Flonase (by prescription only)  Guaifenesin  Mucinex  Robitussin DM (plain only, alcohol free)  Saline nasal spray/drops  Sudafed (pseudoephedrine) & Actifed * use only after [redacted] weeks gestation and if you do not have high blood pressure  Tylenol   Vicks Vaporub  Zinc lozenges  Zyrtec   Constipation:  Colace  Ducolax suppositories  Fleet enema  Glycerin suppositories  Metamucil  Milk of magnesia  Miralax  Senokot  Smooth move tea   Diarrhea:  Kaopectate  Imodium A-D   *NO pepto Bismol   Hemorrhoids:  Anusol  Anusol HC  Preparation H  Tucks   Indigestion:  Tums  Maalox  Mylanta  Zantac  Pepcid    Insomnia:  Benadryl  (alcohol free) 25mg  every 6 hours as needed  Tylenol  PM  Unisom , no Gelcaps   Leg Cramps:  Tums  MagGel   Nausea/Vomiting:  Bonine  Dramamine  Emetrol  Ginger extract  Sea bands  Meclizine  Nausea medication to take during pregnancy:  Unisom  (doxylamine  succinate 25 mg tablets) Take one tablet daily at bedtime. If symptoms are not adequately controlled, the dose can be increased to a maximum recommended dose of two tablets daily (1/2 tablet in the morning, 1/2 tablet  mid-afternoon and one at bedtime).  Vitamin B6 100mg  tablets. Take one tablet twice a day (up to 200 mg per day).   Skin Rashes:  Aveeno products  Benadryl  cream or 25mg  every 6 hours as needed  Calamine Lotion  1% cortisone cream   Yeast infection:  Gyne-lotrimin 7  Monistat 7    **If taking multiple medications, please check labels to avoid duplicating the same active ingredients  **take medication as directed on the label  ** Do not exceed 4000 mg of tylenol  in 24 hours  **Do not take medications that contain aspirin or ibuprofen 

## 2023-12-06 NOTE — MAU Note (Signed)
 Sherry Barton is a 23 y.o. at [redacted]w[redacted]d here in MAU reporting: cough and itchy throat starting 4 days ago. Pt denies VB or LOF. Pos FM  LMP: na Onset of complaint: 4 days ago Pain score: 7/10-- only when coughing Vitals:   12/06/23 1643  BP: 110/64  Pulse: 94  Resp: 17  Temp: 98.9 F (37.2 C)  SpO2: 100%     FHT: 149  Lab orders placed from triage: md orders

## 2023-12-06 NOTE — Progress Notes (Signed)
 After review, MFM consult with provider is not indicated for today  Arna Ranks, MD 12/06/2023 10:58 AM  [facility]

## 2023-12-06 NOTE — MAU Provider Note (Signed)
 History     CSN: [id number]  Arrival date and time: 12/06/23 1525   None     Chief Complaint  Patient presents with   Cough   itchy throat   Cough Associated symptoms include myalgias and a sore throat. Pertinent negatives include no chest pain, rhinorrhea or shortness of breath.    Ms. Elleigh Cassetta is a 23 y.o. year old G1P0 female at [redacted]w[redacted]d weeks gestation who presents to MAU reporting cough with itchy throat.  She reports itchy throat, productive cough w/o visible phlegm, and sneezing that began 5-7 days ago. Tylenol  has not helped. Reports myalgias and night sweats. Denies HA or runny nose. Denies VB or LOF. + FM.  Although reports she does not have a runny nose she does state that she feels some congestion but just not enough to need to blow her nose.  Recent sick contacts: friend with pneumonia and his baby sister who also had similar sx.   PNC at [facility].   OB History     Gravida  1   Para      Term      Preterm      AB      Living         SAB      IAB      Ectopic      Multiple      Live Births              Past Medical History:  Diagnosis Date   Chlamydia     Past Surgical History:  Procedure Laterality Date   NO PAST SURGERIES      Family History  Problem Relation Age of Onset   Healthy Mother    Healthy Father     Social History   Tobacco Use   Smoking status: Never    Passive exposure: Never   Smokeless tobacco: Never  Vaping Use   Vaping status: Never Used  Substance Use Topics   Alcohol use: Never   Drug use: Not Currently    Types: Marijuana    Comment: been a long time, sometime 2024    Allergies:  Allergies  Allergen Reactions   Compazine  [Prochlorperazine ] Other (See Comments)    Pt states it makes her feel off, and she has weird dreams while taking    Medications Prior to Admission  Medication Sig Dispense Refill Last Dose/Taking   Blood Pressure Monitoring (BLOOD PRESSURE KIT) DEVI 1 kit by Does  not apply route once a week. (Patient not taking: Reported on 10/23/2023) 1 each 0    Doxylamine -Pyridoxine  (DICLEGIS ) 10-10 MG TBEC Take 2 tablets by mouth at bedtime. May add 1 tablet at Breakfast and 1 Tablet at Lunch if needed 100 tablet 2    ondansetron  (ZOFRAN -ODT) 4 MG disintegrating tablet Take 1-2 tablets (4-8 mg total) by mouth every 6 (six) hours as needed for nausea or vomiting. (Patient not taking: Reported on 10/23/2023) 30 tablet 1    potassium chloride  SA (KLOR-CON  M) 20 MEQ tablet Take 1 tablet (20 mEq total) by mouth 2 (two) times daily for 2 days. (Patient not taking: Reported on 10/23/2023) 4 tablet 0    Prenatal Vit-Fe Fumarate-FA (MULTIVITAMIN-PRENATAL) 27-0.8 MG TABS tablet Take 1 tablet by mouth daily at 12 noon.      promethazine  (PHENERGAN ) 25 MG tablet Take 1 tablet (25 mg total) by mouth every 6 (six) hours as needed for nausea or vomiting. (Patient not taking: Reported on 10/23/2023) 30 tablet  1    scopolamine  (TRANSDERM-SCOP) 1 MG/3DAYS Place 1 patch (1.5 mg total) onto the skin every 3 (three) days. 10 patch 12     Review of Systems  HENT:  Positive for sneezing and sore throat. Negative for congestion and rhinorrhea.   Respiratory:  Positive for cough. Negative for shortness of breath.   Cardiovascular:  Negative for chest pain.  Gastrointestinal:  Negative for abdominal pain.  Genitourinary:  Negative for vaginal bleeding.  Musculoskeletal:  Positive for myalgias.   Physical Exam   Blood pressure 110/64, pulse 94, temperature 98.9 F (37.2 C), temperature source Oral, resp. rate 17, height 5' 3 (1.6 m), weight 50.3 kg, last menstrual period 06/21/2023, SpO2 100%.  Physical Exam HENT:     Head: Normocephalic.     Nose: Congestion and rhinorrhea present.     Mouth/Throat:     Pharynx: Posterior oropharyngeal erythema present. No oropharyngeal exudate.  Eyes:     Extraocular Movements: Extraocular movements intact.     Conjunctiva/sclera: Conjunctivae normal.   Cardiovascular:     Rate and Rhythm: Normal rate.  Pulmonary:     Effort: Pulmonary effort is normal. No respiratory distress.     Breath sounds: No wheezing.  Abdominal:     Palpations: Abdomen is soft.     Tenderness: There is no abdominal tenderness.  Musculoskeletal:        General: Normal range of motion.     Cervical back: Normal range of motion.  Lymphadenopathy:     Cervical: No cervical adenopathy.  Skin:    General: Skin is warm and dry.  Neurological:     General: No focal deficit present.     Mental Status: She is alert.  Psychiatric:        Mood and Affect: Mood normal.     FHT 149  MAU Course  Procedures  MDM Respiratory panel  Airborne + Contact precautions Type and screen   Assessment and Plan   Viral URI  Respiratory panel negative, sx likely due to another acute viral illness.  Discussed symptomatic management including Tylenol , hot tea with honey, adequate hydration. - Gave list of safe medications in pregnancy in d/c instructions. Advised her to continue to take tylenol . Can take zyrtec, pepcid  as needed.   [redacted] weeks gestation    This is a Psychologist, occupational Note.  The care of the patient was discussed with Dr. Yani Lal and the assessment and plan formulated with their assistance.  Please see their attached note for official documentation of the daily encounter.   LOS: 0 days   Gaines, Megan J, Medical Student 12/06/2023, 5:38 PM  Attending Attestation  I saw and evaluated the patient, performing the key elements of the service.I  personally performed or re-performed the history, physical exam, and medical decision making activities of this service and have verified that the service and findings are accurately documented in the student's note. I developed the management plan that is described in the student's note, and I agree with the content, with my edits above.    Steffan Rover, MD Attending Family Medicine Physician, [facility] for [facility], [facility]

## 2023-12-18 ENCOUNTER — Ambulatory Visit: Admitting: Advanced Practice Midwife

## 2023-12-18 ENCOUNTER — Encounter: Payer: Self-pay | Admitting: Advanced Practice Midwife

## 2023-12-18 VITALS — BP 113/64 | HR 121 | Wt 114.4 lb

## 2023-12-18 DIAGNOSIS — O219 Vomiting of pregnancy, unspecified: Secondary | ICD-10-CM | POA: Diagnosis not present

## 2023-12-18 DIAGNOSIS — Z3A24 24 weeks gestation of pregnancy: Secondary | ICD-10-CM

## 2023-12-18 DIAGNOSIS — D563 Thalassemia minor: Secondary | ICD-10-CM | POA: Diagnosis not present

## 2023-12-18 DIAGNOSIS — Z3480 Encounter for supervision of other normal pregnancy, unspecified trimester: Secondary | ICD-10-CM

## 2023-12-18 NOTE — Progress Notes (Signed)
 Pt presents for ROB visit. Pt c/o nausea hot flashes and dizziness when working. Denies syncopal episodes.

## 2023-12-18 NOTE — Progress Notes (Addendum)
   PRENATAL VISIT NOTE  Subjective:  Sherry Barton is a 23 y.o. G1P0 at [redacted]w[redacted]d being seen today for ongoing prenatal care.  She is currently monitored for the following issues for this low-risk pregnancy and has Supervision of other normal pregnancy, antepartum and Alpha thalassemia silent carrier on their problem list.  Patient reports nausea. Patient has a scope patch and zofran  at home that she will start using again. She will also continue the diclegis  daily.  Contractions: Not present. Vag. Bleeding: None.  Movement: Present. Denies leaking of fluid.   The following portions of the patient's history were reviewed and updated as appropriate: allergies, current medications, past family history, past medical history, past social history, past surgical history and problem list.   Objective:    Vitals:   12/18/23 1432  BP: 113/64  Pulse: (!) 121  Weight: 51.9 kg    Fetal Status:  Fetal Heart Rate (bpm): 141 Fundal Height: 24 cm Movement: Present    General: Alert, oriented and cooperative. Patient is in no acute distress.  Skin: Skin is warm and dry. No rash noted.   Cardiovascular: Normal heart rate noted  Respiratory: Normal respiratory effort, no problems with respiration noted  Abdomen: Soft, gravid, appropriate for gestational age.  Pain/Pressure: Absent     Pelvic: Cervical exam deferred        Extremities: Normal range of motion.  Edema: Trace  Mental Status: Normal mood and affect. Normal behavior. Normal judgment and thought content.   Assessment and Plan:  Pregnancy: G1P0 at [redacted]w[redacted]d 1. Supervision of other normal pregnancy, antepartum (Primary) Doing well. BP and FHR normal today.  Follow up MFM on 02/12/24 for growth.  2. [redacted] weeks gestation of pregnancy Follow up ROB and GTT in 4 weeks.  Patient advised to fast after midnight for GTT.  3. Nausea and vomiting during pregnancy Patient will continue taking Diclegis  and resume use of the Scope patch and Zofran  that  she has at home.  4. Alpha thalassemia silent carrier FOB has saliva kit.   Preterm labor symptoms and general obstetric precautions including but not limited to vaginal bleeding, contractions, leaking of fluid and fetal movement were reviewed in detail with the patient. Please refer to After Visit Summary for other counseling recommendations.   Return in about 4 weeks (around 01/15/2024) for ROB, 2 HR GTT.  Future Appointments  Date Time Provider Department Center  02/12/2024  2:00 PM [facility] PROVIDER 1 [facility]  02/12/2024  2:15 PM [facility] US5 [facility]    Ariel JINNY Freund, NP Student  Midwife Attestation:  I personally saw and evaluated the patient, performing the key elements of the service. I developed and verified the management plan that is described in the resident's/student's note, and I agree with the content with my edits above. VSS, HRR&R, Resp unlabored, Legs neg.    Olam Boards, CNM 5:52 PM

## 2023-12-21 ENCOUNTER — Inpatient Hospital Stay (HOSPITAL_COMMUNITY)
Admission: AD | Admit: 2023-12-21 | Discharge: 2023-12-21 | Disposition: A | Discharge status: Home / Self Care | Attending: Obstetrics and Gynecology | Admitting: Obstetrics and Gynecology

## 2023-12-21 ENCOUNTER — Encounter (HOSPITAL_COMMUNITY): Payer: Self-pay | Admitting: Obstetrics and Gynecology

## 2023-12-21 DIAGNOSIS — Z3689 Encounter for other specified antenatal screening: Secondary | ICD-10-CM | POA: Diagnosis not present

## 2023-12-21 DIAGNOSIS — Z3A24 24 weeks gestation of pregnancy: Secondary | ICD-10-CM | POA: Diagnosis not present

## 2023-12-21 DIAGNOSIS — O219 Vomiting of pregnancy, unspecified: Secondary | ICD-10-CM | POA: Insufficient documentation

## 2023-12-21 DIAGNOSIS — R109 Unspecified abdominal pain: Secondary | ICD-10-CM | POA: Diagnosis present

## 2023-12-21 LAB — URINALYSIS, ROUTINE W REFLEX MICROSCOPIC
Bilirubin Urine: NEGATIVE
Glucose, UA: NEGATIVE mg/dL
Hgb urine dipstick: NEGATIVE
Ketones, ur: NEGATIVE mg/dL
Leukocytes,Ua: NEGATIVE
Nitrite: NEGATIVE
Protein, ur: NEGATIVE mg/dL
Specific Gravity, Urine: 1.018 (ref 1.005–1.030)
pH: 7 (ref 5.0–8.0)

## 2023-12-21 MED ORDER — DOXYLAMINE-PYRIDOXINE 10-10 MG PO TBEC: 2.0000 | DELAYED_RELEASE_TABLET | Freq: Every day | ORAL | 5 refills | Status: AC

## 2023-12-21 MED ORDER — SCOPOLAMINE 1 MG/3DAYS TD PT72: 1.0000 | MEDICATED_PATCH | TRANSDERMAL | 12 refills | Status: DC

## 2023-12-21 MED ORDER — ONDANSETRON HCL 4 MG/2ML IJ SOLN
4.0000 mg | Freq: Once | INTRAMUSCULAR | Status: AC
  Administered 2023-12-21: 4 mg via INTRAVENOUS
  Filled 2023-12-21: qty 2

## 2023-12-21 MED ORDER — SCOPOLAMINE 1 MG/3DAYS TD PT72: 1.0000 | MEDICATED_PATCH | TRANSDERMAL | 12 refills | Status: AC

## 2023-12-21 MED ORDER — LACTATED RINGERS IV BOLUS
1000.0000 mL | Freq: Once | INTRAVENOUS | Status: AC
Start: 2023-12-21 — End: 2023-12-21
  Administered 2023-12-21: 1000 mL via INTRAVENOUS

## 2023-12-21 MED ORDER — SCOPOLAMINE 1 MG/3DAYS TD PT72
1.0000 | MEDICATED_PATCH | Freq: Once | TRANSDERMAL | Status: DC
  Administered 2023-12-21: 1.5 mg via TRANSDERMAL
  Filled 2023-12-21: qty 1

## 2023-12-21 MED ORDER — DOXYLAMINE-PYRIDOXINE 10-10 MG PO TBEC: 2.0000 | DELAYED_RELEASE_TABLET | Freq: Every day | ORAL | 5 refills | Status: DC

## 2023-12-21 NOTE — MAU Note (Signed)
 Sherry Barton is a 23 y.o. at [redacted]w[redacted]d here in MAU reporting: been having bad morning sickness this morning, ,also threw up last night.  Been having cramping since this morning, around 9/10.  Can't tell me how often- she thinks it is related to the vomiting.  Been having pain in her inner thighs, pain is constant- started yesterday. No vag bleeding. No LOF.  +FM  Onset of complaint: yesterday Pain score: abd mod/sev; thigh severe Vitals:   12/21/23 1711  BP: 119/61  Pulse: 94  Resp: 16  Temp: 98.5 F (36.9 C)  SpO2: 99%     FHT:146 Lab orders placed from triage:  urine

## 2023-12-21 NOTE — MAU Provider Note (Addendum)
 History     CSN: [id number]  Arrival date and time: 12/21/23 1646    Chief Complaint  Patient presents with   Abdominal Pain   Nausea   Emesis   thigh pain   Abdominal Pain Associated symptoms include nausea and vomiting. Pertinent negatives include no constipation, diarrhea, dysuria or fever.  Emesis  Associated symptoms include abdominal pain. Pertinent negatives include no chest pain, chills, diarrhea, dizziness or fever.    Sherry Barton is a 23 y.o. G1P0 at [redacted]w[redacted]d who presents for evaluation of cramping and vomiting. Patient reports vomiting twice last night and three times this morning. States she noticed intermittent cramping that started in her lower belly and side around 10am that feels like a pulling sensation. She has also noticed thigh soreness as well and wonders if it is related to recently starting back at work. She takes Diclegis  two tablets at bedtime and one during the day if needed as well as a Scopolamine  patch. She states she often runs out of patches before the end of the month when she is able to get a refill. She has only been able to keep down a little ginger ale and some fries recently.  Patient has no pain at this time.  She denies any vaginal bleeding, discharge, and leaking of fluid. Denies any constipation, diarrhea or any urinary complaints. Reports normal fetal movement.   OB History     Gravida  1   Para      Term      Preterm      AB      Living         SAB      IAB      Ectopic      Multiple      Live Births              Past Medical History:  Diagnosis Date   Chlamydia     Past Surgical History:  Procedure Laterality Date   NO PAST SURGERIES      Family History  Problem Relation Age of Onset   Healthy Mother    Healthy Father     Social History   Tobacco Use   Smoking status: Never    Passive exposure: Never   Smokeless tobacco: Never  Vaping Use   Vaping status: Never Used  Substance Use Topics    Alcohol use: Never   Drug use: Not Currently    Types: Marijuana    Comment: been a long time, sometime 2024    Allergies:  Allergies  Allergen Reactions   Compazine  [Prochlorperazine ] Other (See Comments)    Pt states it makes her feel off, and she has weird dreams while taking    Medications Prior to Admission  Medication Sig Dispense Refill Last Dose/Taking   Doxylamine -Pyridoxine  (DICLEGIS ) 10-10 MG TBEC Take 2 tablets by mouth at bedtime. May add 1 tablet at Breakfast and 1 Tablet at Lunch if needed 100 tablet 2 12/20/2023   Prenatal Vit-Fe Fumarate-FA (MULTIVITAMIN-PRENATAL) 27-0.8 MG TABS tablet Take 1 tablet by mouth daily at 12 noon.   12/21/2023   scopolamine  (TRANSDERM-SCOP) 1 MG/3DAYS Place 1 patch (1.5 mg total) onto the skin every 3 (three) days. 10 patch 12 Past Week   Blood Pressure Monitoring (BLOOD PRESSURE KIT) DEVI 1 kit by Does not apply route once a week. (Patient not taking: Reported on 12/18/2023) 1 each 0    ondansetron  (ZOFRAN -ODT) 4 MG disintegrating tablet Take 1-2 tablets (4-8 mg  total) by mouth every 6 (six) hours as needed for nausea or vomiting. (Patient not taking: Reported on 12/18/2023) 30 tablet 1    potassium chloride  SA (KLOR-CON  M) 20 MEQ tablet Take 1 tablet (20 mEq total) by mouth 2 (two) times daily for 2 days. (Patient not taking: Reported on 12/18/2023) 4 tablet 0    promethazine  (PHENERGAN ) 25 MG tablet Take 1 tablet (25 mg total) by mouth every 6 (six) hours as needed for nausea or vomiting. (Patient not taking: Reported on 12/18/2023) 30 tablet 1     Review of Systems  Constitutional:  Positive for appetite change. Negative for chills, fatigue and fever.  Respiratory:  Negative for shortness of breath.   Cardiovascular:  Negative for chest pain.  Gastrointestinal:  Positive for abdominal pain, nausea and vomiting. Negative for constipation and diarrhea.  Genitourinary:  Negative for dysuria, vaginal bleeding and vaginal discharge.   Musculoskeletal:  Negative for back pain.  Neurological:  Negative for dizziness and light-headedness.   Physical Exam   Blood pressure 119/61, pulse 94, temperature 98.5 F (36.9 C), temperature source Oral, resp. rate 16, height 5' 3 (1.6 m), weight 51.2 kg, last menstrual period 06/21/2023, SpO2 99%.  Patient Vitals for the past 24 hrs:  BP Temp Temp src Pulse Resp SpO2 Height Weight  12/21/23 1711 119/61 98.5 F (36.9 C) Oral 94 16 99 % 5' 3 (1.6 m) 51.2 kg    Physical Exam Vitals reviewed.  Constitutional:      Appearance: She is well-developed.  Cardiovascular:     Rate and Rhythm: Normal rate.  Pulmonary:     Effort: Pulmonary effort is normal.  Abdominal:     Palpations: Abdomen is soft.     Tenderness: There is no abdominal tenderness.  Neurological:     Mental Status: She is alert and oriented to person, place, and time.    FHRT: Cat 1 for GA  Baseline 140 Variability: Moderate  Accelerations: Present Decelerations: Absent Toco: No CTX    MAU Course  Procedures  Results for orders placed or performed during the hospital encounter of 12/21/23 (from the past 24 hours)  Urinalysis, Routine w reflex microscopic -Urine, Clean Catch     Status: Abnormal   Collection Time: 12/21/23  5:38 PM  Result Value Ref Range   Color, Urine YELLOW YELLOW   APPearance HAZY (A) CLEAR   Specific Gravity, Urine 1.018 1.005 - 1.030   pH 7.0 5.0 - 8.0   Glucose, UA NEGATIVE NEGATIVE mg/dL   Hgb urine dipstick NEGATIVE NEGATIVE   Bilirubin Urine NEGATIVE NEGATIVE   Ketones, ur NEGATIVE NEGATIVE mg/dL   Protein, ur NEGATIVE NEGATIVE mg/dL   Nitrite NEGATIVE NEGATIVE   Leukocytes,Ua NEGATIVE NEGATIVE       MDM Prenatal records from community office reviewed. Pregnancy uncomplicated. Labs ordered and reviewed.   LR bolus Zofran  4mg  IV Scopolamine  Patch   Care turned over to [facility], [facility]  [place], [facility] 12/21/2023, 6:28 PM  Assessment and Plan    I resumed care of this patient at 2000 Patient reported feeling better and tolerate PO Challenge Requesting Discharge with refills for Antiemetics  Discharge from MAU in stable condition  See AVS for full description of educational information and instructions provided to the patient at time of discharge   Warning signs for worsening condition that would warrant emergency follow-up discussed Patient may return to MAU as needed   Nausea and vomiting during pregnancy  [redacted] weeks gestation of pregnancy  NST (non-stress test) reactive on fetal surveillance   Allergies as of 12/21/2023       Reactions   Compazine  [prochlorperazine ] Other (See Comments)   Pt states it makes her feel off, and she has weird dreams while taking        Medication List     TAKE these medications    Blood Pressure Kit Devi 1 kit by Does not apply route once a week.   Doxylamine -Pyridoxine  10-10 MG Tbec Commonly known as: Diclegis  Take 2 tablets by mouth at bedtime. If symptoms persist, add one tablet in the morning and one in the afternoon What changed: additional instructions   Doxylamine -Pyridoxine  10-10 MG Tbec Commonly known as: Diclegis  Take 2 tablets by mouth at bedtime. If symptoms persist, add one tablet in the morning and one in the afternoon What changed: You were already taking a medication with the same name, and this prescription was added. Make sure you understand how and when to take each.   multivitamin-prenatal 27-0.8 MG Tabs tablet Take 1 tablet by mouth daily at 12 noon.   ondansetron  4 MG disintegrating tablet Commonly known as: ZOFRAN -ODT Take 1-2 tablets (4-8 mg total) by mouth every 6 (six) hours as needed for nausea or vomiting.   potassium chloride  SA 20 MEQ tablet Commonly known as: KLOR-CON  M Take 1 tablet (20 mEq total) by mouth 2 (two) times daily for 2 days.   promethazine  25 MG tablet Commonly known as: PHENERGAN  Take 1 tablet (25 mg total) by mouth every  6 (six) hours as needed for nausea or vomiting.   scopolamine  1 MG/3DAYS Commonly known as: TRANSDERM-SCOP Place 1 patch (1.5 mg total) onto the skin every 3 (three) days. What changed: Another medication with the same name was added. Make sure you understand how and when to take each.   scopolamine  1 MG/3DAYS Commonly known as: TRANSDERM-SCOP Place 1 patch (1.5 mg total) onto the skin every 3 (three) days. What changed: You were already taking a medication with the same name, and this prescription was added. Make sure you understand how and when to take each.

## 2024-01-04 ENCOUNTER — Inpatient Hospital Stay (HOSPITAL_COMMUNITY)
Admission: AD | Admit: 2024-01-04 | Discharge: 2024-01-04 | Disposition: A | Discharge status: Home / Self Care | Payer: Self-pay | Attending: Obstetrics and Gynecology | Admitting: Obstetrics and Gynecology

## 2024-01-04 ENCOUNTER — Other Ambulatory Visit: Payer: Self-pay

## 2024-01-04 DIAGNOSIS — O212 Late vomiting of pregnancy: Secondary | ICD-10-CM | POA: Diagnosis present

## 2024-01-04 DIAGNOSIS — Z3A26 26 weeks gestation of pregnancy: Secondary | ICD-10-CM | POA: Diagnosis not present

## 2024-01-04 DIAGNOSIS — O219 Vomiting of pregnancy, unspecified: Secondary | ICD-10-CM | POA: Diagnosis not present

## 2024-01-04 DIAGNOSIS — Z3480 Encounter for supervision of other normal pregnancy, unspecified trimester: Secondary | ICD-10-CM

## 2024-01-04 LAB — URINALYSIS, ROUTINE W REFLEX MICROSCOPIC
Bilirubin Urine: NEGATIVE
Glucose, UA: 150 mg/dL — AB
Hgb urine dipstick: NEGATIVE
Ketones, ur: NEGATIVE mg/dL
Leukocytes,Ua: NEGATIVE
Nitrite: NEGATIVE
Protein, ur: 30 mg/dL — AB
Specific Gravity, Urine: 1.024 (ref 1.005–1.030)
pH: 7 (ref 5.0–8.0)

## 2024-01-04 MED ORDER — ONDANSETRON 4 MG PO TBDP: 4.0000 mg | ORAL_TABLET | Freq: Four times a day (QID) | ORAL | 1 refills | Status: AC | PRN

## 2024-01-04 MED ORDER — ONDANSETRON 4 MG PO TBDP
8.0000 mg | ORAL_TABLET | Freq: Once | ORAL | Status: AC
  Administered 2024-01-04: 8 mg via ORAL
  Filled 2024-01-04: qty 2

## 2024-01-04 NOTE — MAU Provider Note (Signed)
 Chief Complaint:  Emesis and Nausea   HPI   None     Sherry Barton is a 23 y.o. G1P0 at [redacted]w[redacted]d who presents to maternity admissions reporting nausea and vomiting, she reports vomiting 7-8 times in 24 hours, once this morning. She is not actively vomiting. She has used her scopolamine  and diclegis , with some improvement. She has been having increase in these symptoms over the past few days. She denies sick contacts.   No OB complaints.   Pregnancy Course: [facility] - [place] pregnancy  Past Medical History:  Diagnosis Date   Chlamydia    OB History  Gravida Para Term Preterm AB Living  1       SAB IAB Ectopic Multiple Live Births          # Outcome Date GA Lbr Len/2nd Weight Sex Type Anes PTL Lv  1 Current            Past Surgical History:  Procedure Laterality Date   NO PAST SURGERIES     Family History  Problem Relation Age of Onset   Healthy Mother    Healthy Father    Social History   Tobacco Use   Smoking status: Never    Passive exposure: Never   Smokeless tobacco: Never  Vaping Use   Vaping status: Never Used  Substance Use Topics   Alcohol use: Never   Drug use: Not Currently    Types: Marijuana    Comment: been a long time, sometime 2024   Allergies  Allergen Reactions   Compazine  [Prochlorperazine ] Other (See Comments)    Pt states it makes her feel off, and she has weird dreams while taking   No medications prior to admission.    I have reviewed patient's Past Medical Hx, Surgical Hx, Family Hx, Social Hx, medications and allergies.   ROS  Pertinent items noted in HPI and remainder of comprehensive ROS otherwise negative.   PHYSICAL EXAM  Patient Vitals for the past 24 hrs:  BP Temp Temp src Pulse Resp SpO2 Height Weight  01/04/24 1418 (!) 115/59 98.1 F (36.7 C) Oral 91 18 98 % -- --  01/04/24 1414 -- -- -- -- -- -- 5' 3 (1.6 m) 53 kg    Physical Exam Vitals and nursing note reviewed.  Constitutional:      General: She is not in acute  distress.    Appearance: Normal appearance. She is not ill-appearing.  HENT:     Head: Normocephalic.  Cardiovascular:     Rate and Rhythm: Normal rate.     Pulses: Normal pulses.  Pulmonary:     Effort: Pulmonary effort is normal.  Skin:    General: Skin is warm and dry.     Capillary Refill: Capillary refill takes less than 2 seconds.  Neurological:     General: No focal deficit present.     Mental Status: She is alert and oriented to person, place, and time.  Psychiatric:        Mood and Affect: Mood normal.        Behavior: Behavior normal.        Thought Content: Thought content normal.        Judgment: Judgment normal.         FetalHR: 137   Labs: Results for orders placed or performed during the hospital encounter of 01/04/24 (from the past 24 hours)  Urinalysis, Routine w reflex microscopic -Urine, Clean Catch     Status: Abnormal   Collection  Time: 01/04/24  2:46 PM  Result Value Ref Range   Color, Urine YELLOW YELLOW   APPearance CLOUDY (A) CLEAR   Specific Gravity, Urine 1.024 1.005 - 1.030   pH 7.0 5.0 - 8.0   Glucose, UA 150 (A) NEGATIVE mg/dL   Hgb urine dipstick NEGATIVE NEGATIVE   Bilirubin Urine NEGATIVE NEGATIVE   Ketones, ur NEGATIVE NEGATIVE mg/dL   Protein, ur 30 (A) NEGATIVE mg/dL   Nitrite NEGATIVE NEGATIVE   Leukocytes,Ua NEGATIVE NEGATIVE   RBC / HPF 0-5 0 - 5 RBC/hpf   WBC, UA 0-5 0 - 5 WBC/hpf   Bacteria, UA RARE (A) NONE SEEN   Squamous Epithelial / HPF 21-50 0 - 5 /HPF   Mucus PRESENT     Imaging:  No results found.  MDM & MAU COURSE  MDM: Moderate Rule out dehydration - UA displays proper hydration Zofran  offered and accepted for use in lobby with good results.  Will refill Zofran , recommend continued hydration  MAU Course: Orders Placed This Encounter  Procedures   Urinalysis, Routine w reflex microscopic -Urine, Clean Catch   Discharge patient   Meds ordered this encounter  Medications   ondansetron  (ZOFRAN -ODT)  disintegrating tablet 8 mg   ondansetron  (ZOFRAN -ODT) 4 MG disintegrating tablet    Sig: Take 1-2 tablets (4-8 mg total) by mouth every 6 (six) hours as needed for nausea or vomiting.    Dispense:  30 tablet    Refill:  1    Supervising Provider:   PRATT, TANYA S [[id number]]    ASSESSMENT   1. Supervision of other normal pregnancy, antepartum   2. Nausea and vomiting during pregnancy     PLAN  Discharge home in stable condition.  Refill Zofran  4mg  PRN q 6h.  Maintain hydration with mix of electrolyte beverages and water. Return to MAU PRN.      Allergies as of 01/04/2024       Reactions   Compazine  [prochlorperazine ] Other (See Comments)   Pt states it makes her feel off, and she has weird dreams while taking        Medication List     STOP taking these medications    promethazine  25 MG tablet Commonly known as: PHENERGAN        TAKE these medications    Blood Pressure Kit Devi 1 kit by Does not apply route once a week.   Doxylamine -Pyridoxine  10-10 MG Tbec Commonly known as: Diclegis  Take 2 tablets by mouth at bedtime. If symptoms persist, add one tablet in the morning and one in the afternoon   Doxylamine -Pyridoxine  10-10 MG Tbec Commonly known as: Diclegis  Take 2 tablets by mouth at bedtime. If symptoms persist, add one tablet in the morning and one in the afternoon   multivitamin-prenatal 27-0.8 MG Tabs tablet Take 1 tablet by mouth daily at 12 noon.   ondansetron  4 MG disintegrating tablet Commonly known as: ZOFRAN -ODT Take 1-2 tablets (4-8 mg total) by mouth every 6 (six) hours as needed for nausea or vomiting.   potassium chloride  SA 20 MEQ tablet Commonly known as: KLOR-CON  M Take 1 tablet (20 mEq total) by mouth 2 (two) times daily for 2 days.   scopolamine  1 MG/3DAYS Commonly known as: TRANSDERM-SCOP Place 1 patch (1.5 mg total) onto the skin every 3 (three) days.   scopolamine  1 MG/3DAYS Commonly known as: TRANSDERM-SCOP Place 1 patch (1.5 mg  total) onto the skin every 3 (three) days.        Camie Rote, MSN, CNM  01/04/2024 4:29 PM  Certified Nurse Midwife, [facility]

## 2024-01-04 NOTE — Discharge Instructions (Signed)
 I recommend taking the Zofran  as needed up to every 6 hours for break through nausea.   Thank you for trusting us  to care for you, Sherry Barton, Midwife

## 2024-01-04 NOTE — MAU Note (Signed)
 Sherry Barton is a 23 y.o. at [redacted]w[redacted]d here in MAU reporting: she's been throwing up for the past few days and feels weak.  Reports threw up earlier this morning, has vomited 7-8 times in past 24 hours.  Reports took Diclegis  last night and is currently wearing patch.  Denies VB or LOF.  Endorses +FM.  LMP: 04/05/2024 Onset of complaint: few days Pain score: 7 bilateral legs sore and weak Vitals:   01/04/24 1418  BP: (!) 115/59  Pulse: 91  Resp: 18  Temp: 98.1 F (36.7 C)  SpO2: 98%     FHT: 137 bpm  Lab orders placed from triage: UA

## 2024-01-15 ENCOUNTER — Ambulatory Visit (INDEPENDENT_AMBULATORY_CARE_PROVIDER_SITE_OTHER): Payer: Self-pay | Admitting: Obstetrics and Gynecology

## 2024-01-15 ENCOUNTER — Encounter: Payer: Self-pay | Admitting: Obstetrics and Gynecology

## 2024-01-15 ENCOUNTER — Other Ambulatory Visit

## 2024-01-15 VITALS — BP 102/63 | HR 73

## 2024-01-15 DIAGNOSIS — Z23 Encounter for immunization: Secondary | ICD-10-CM | POA: Diagnosis not present

## 2024-01-15 DIAGNOSIS — O99013 Anemia complicating pregnancy, third trimester: Secondary | ICD-10-CM

## 2024-01-15 DIAGNOSIS — Z3A28 28 weeks gestation of pregnancy: Secondary | ICD-10-CM | POA: Diagnosis not present

## 2024-01-15 DIAGNOSIS — O219 Vomiting of pregnancy, unspecified: Secondary | ICD-10-CM

## 2024-01-15 DIAGNOSIS — Z3480 Encounter for supervision of other normal pregnancy, unspecified trimester: Secondary | ICD-10-CM

## 2024-01-15 DIAGNOSIS — D563 Thalassemia minor: Secondary | ICD-10-CM

## 2024-01-15 DIAGNOSIS — Z1332 Encounter for screening for maternal depression: Secondary | ICD-10-CM

## 2024-01-15 NOTE — Patient Instructions (Addendum)
 [facility] Pediatric Providers  [place] ([id number]) [facility] Manson Passey, MD; Deirdre Priest, MD; Lum Babe, MD; Leveda Anna, MD; McDiarmid, MD; Jerene Bears, MD [address] [place] [place], [place] [address] [phone] Mon-Fri 8:30-12:30, 1:30-5:00  Providers come to see babies during newborn hospitalization Only accepting infants of Mother's who are seen at [facility] or have siblings seen at   [facility] Medicaid - Yes; Tricare - Yes   [facility] [place], MD [address] [place], [place] [address] [phone] Mon, Tue, Thur, Fri 8:30-5:00, Wed 10:00-7:00 (closed 1-2pm daily for lunch) [facility] residents with no insurance.  [place] [employer] only with Medicaid/insurance; Tricare - no  [facility]) - Tim and [facility], MD; Manson Passey, MD; Ave Filter, MD; Luna Fuse, MD; Kennedy Bucker, MD; Florestine Avers, MD; Melchor Amour, MD; Yetta Barre,  MD; Konrad Dolores, MD; Kathlene November, MD; Jenne Campus, MD; Wynetta Emery, MD; Duffy Rhody, MD; Gerre Couch, NP [address] [place], [place], [place] [address] [phone] Mon, Tue, Thur, Fri 8:30-5:30, Wed 9:30-5:30, Sat 8:30-12:30 Only accepting infants of first-time parents or siblings of current patients Hospital discharge coordinator will make follow-up appointment Medicaid - yes; Tricare - yes  [place] [phone]) [facility] Ilean China, MD; Earlene Plater, MD; Jamesetta Orleans, MD; Alvera Novel, MD; Rana Snare, MD; [facility], MD; Shaaron Adler, MD; Hosie Poisson, MD; Mayford Knife, MD [address], [place], [place] [address] [phone] Mon-Fri 8:30-5:00, closed for lunch 12:30-1:30; Sat-Sun 10:00-1:00 Accepting Newborns with commercial insurance only, must call prior to delivery to be accepted into  practice.  Medicaid - no, Tricare - yes   [facility] [address] [place], [place] [address] [phone] or [phone] Mon to Fri 8am to 10pm, Sat 8am to 1pm  (virtual only on weekends) Only accepts Medicaid Healthy Blue pts  [facility]) - Pediatrics at Elige Radon, MD; Sabino Dick, MD; Quitman Livings, MD; Betha Loa, NP; Claretha Cooper, MD; Lelon Perla, MD [address] [place] [place], [place] [address] [phone] Mon-Fri 8:30-5:30 Medicaid - yes, Tricare - yes  [place] [phone]) [facility] Marcie Mowers, MD [address], [place], [place] [address] [phone] Iona Hansen, Wed Fri 8:30-5:00, Sat 8:30-12:00, Closed Thursdays Accepting siblings of established patients and first time mom's if you call prenatally Medicaid- yes; Tricare - yes  [facility] Lutricia Feil, [place]; Tracie Harrier, MD; Rusty Aus; Scif[place], PA; Wynelle Link, MD; Azucena Cecil, MD;  [address] [place], [place] [address] [phone] Mon-Fri 8:30-5:00, closed for lunch 1-2 Only accepting newborns of established patients Medicaid- no; Tricare - yes  [facility] [phone]) [facility] Morene Crocker, MD; [address], [place], [place] [address] [phone] Mon-Fri 8:00-5:00 Medicaid - No; Tricare - Yes  [facility], [place]; [place], [place] [address] [place], [place] [address] [phone] Mon-Fri 8:00-5:00 Medicaid - No, Tricare - Yes  [facility] Cardell Peach, MD; Nash Dimmer, MD; [place], [place] [address] [place], [place] [address] [phone]  Mon-Fri 8:00-5:00 Medicaid - No; Tricare - Yes  [facility] [address], [place], [place] [address] [phone] Mon-Fri 8:30-5:00 (lunch 12:00-1:00) Medicaid -Yes; Tricare - Yes  [place] [facility] [place], MD [address] [place], [place], [place] [address] [phone] Mon-Fri 8:00-5:00 Seeing newborns of current patients only. No new patients Medicaid - No, Tricare - yes  Nature conservation officer at [place] [address] MD [address] [place], [place] [address] [phone] Mon-Fri 8:00-5:00 Medicaid -yes as secondary coverage only;  Tricare - yes  [facility] [place], [place]; [place], [place]; Avis Epley, MD; Vonna Kotyk, MD; Clance Boll, MD; [place], [place]; [place]oot, NP; Vaughan Basta, MD; [place], MD [address] [place], [place] [address] [phone] Mon-Fri 8:30-5:00, Sat 9:00-11:00 Accepts commercial insurance ONLY. Offers free prenatal information sessions for families. Medicaid - No, Tricare - Call first  [facility] [place], MD; [place], [place]; [place], [place]; [employer], [place] [address] [place] [address] [phone] Mon-Fri 7:30-5:30 Medicaid - Yes; Ailene Rud yes  [place] [phone] & [phone])  [facility], MD [address] [place], [place] [address] [phone] Mon-Thur 8:00-6:00, closed for lunch 12-2, closed Fridays Medicaid - yes; Tricare - no  [facility] Dareen Piano, NP; Cyndia Bent, MD; [place], [place]; [place], [place] [address], [place], [place] [address] [phone] Mon-Fri 7:30-4:30 Medicaid - yes, Tricare - yes  [facility]  Juanito Doom, MD; Janene Harvey, NP; Vonita Moss, MD; Donn Pierini, NP [address], [place], [place] [address] [phone] Mon-Fri 8:30-5:00, closed for lunch 1-2, Sat 8:30-12:00 - sick visits only Providers come to see babies at [facility] Only accepting newborns of siblings and first time parents ONLY if who have met with office prior to delivery Medicaid -Yes; Tricare - yes  [facility] - [place], [place]; Spero Geralds, NP; Earlene Plater, MD; Lucretia Roers, MD:  [address], [place], [place] [address] [phone] Mon- Fri 8:00-5:00, Sat 9:00-12:00 - sick visits only Accepting siblings of established patients and first time mom/baby Medicaid - Yes; Tricare - yes Patients must have vaccinations (baby vaccines)  [place] [phone] &  [phone])  Adult nurse HealthCare at [facility] [address] [place], [place] [address] [phone] Mon-Fri 8:00-5:00 Medicaid - no; Tricare - yes  [facility] [place], MD; [place], [place]; [place], [place] [address], [place], [place] [address] [phone] Mon-Fri 8:00-5:00 Medicaid- yes; Tricare - yes  [facility] - Ardeen Jourdain, MD; Yetta Barre, NP; [place], [place] [address] [place], [place], [place] [address] [phone] Mon-Fri 8:00-5:00 Medicaid - Yes; Tricare - yes  [address] [place] [phone])  [facility], [place]; [place], [place]; Eddie Candle, MD; Normand Sloop, MD; [place], NP; [place], DO; [place], [place]; Constance Goltz, MD; Ruthann Cancer, MD; Vear Clock, MD; [place], [place]; [place], [place]; [place], [place] [facility] [address], [place], [place] [address] [phone] Mon-Fri 8:30-5:00, Sat 9:00-12:00 - sick only Please register online [email or web address] then schedule online or call office Medicaid-Yes; Tricare -yes  [facility] - Premier  [place], MD; Romualdo Bolk, MD; [place], MD; [place], NP; [place], [place]; Antonietta Barcelona, MD; Mayford Knife, NP; Shelva Majestic, MD [address], [place], [place] [address] [phone] Mon-Fri 8:00-5:30, Sat&Sun by appointment (phones open at 8:30) Medicaid - Yes; Tricare - yes  [place] [phone] & [phone]) [facility] Mariel Aloe; [place], MD; Roger Shelter, MD; Arvilla Market, NP; [place], DO [address], [place], [place] [address] [phone] M-F 8:00 - 5:15, Sat/Sun 9-12 sick visits only Medicaid - No; Tricare - yes  [facility]  [place], PA-C; [place], PA-C; [place], DO; [place], PA-C; [place], PA-C; Roselyn Bering, MD [address] [place], [place] [address] [phone] Mon-Thur 8:00-7:00, Fri 8:00-5:00 Accepting Medicaid for 13 and under only   [facility] Family Medicine  at [place] [facility] [place] Reid Hope [place], [place]; List, FNP; Berneda Rose, MD; [place], PA-C; Scholer,  MD; Kellie Simmering, FNP; Genevie Cheshire, FNP; Evaristo Bury, MD; Berneda Rose, MD [phone] [address], [place], [place] [address] [phone] Mon-Fri 8:30-5:30 Medicaid - Yes; Tricare - yes  [facility] - [address]  [place], [place]; Whitney Post, MD; Hennie Duos, MD; Wynne Dust, MD; [place], NP [address], [place], [place] [address] [phone] Mon-Thur 8:00-5:30, Fri 8:00-5:00, Sat 9:00-12:00 Medicaid - yes, Tricare - yes  [place] [phone])  [facility], [place]; Lenise Arena, MD; [place], [place] [address], [place], [place] [address] [phone] Mon-Fri 8:00-5:00, closed for lunch 12-1 Medicaid - No; Tricare - yes  Nature conservation officer at [facility], MD [address] [place], [place] [address] [phone] Mon-Fri 8:00-5:00 Medicaid - No; Tricare - yes  [facility] - [facility], MD; Tami Ribas, MD; Mariam Dollar, MD; Yetta Barre, MD [address], [place], [place] [address] [phone] Mon-Fri 8:00-5:00 Medicaid- Yes; Tricare - yes  [place] [phone])  Adult nurse HealthCare at [facility], [place]; [place], MD [address] [place], [place], [place] [address] [phone] Mon-Fri 8:00-5:00 Medicaid - No; Tricare - yes  [facility] - Whitney Post - CPNP [address] [place] [address], [place], [place] [address] [phone] Mon-Weds 8:00-6:00, Thurs-Fri 8:00-5:00, Sat 9:00-12:00 Medicaid - yes; Tricare - yes   [facility] Katharina Caper, MD; [place], [place] [address] [place], [place] [address] [phone] Mon-Fri 8:00-5:00 Medicaid - yes; Tricare - yes  [facility] Pediatric Providers  [facility] [address], [place], [place] [address] [phone] Sheral Flow: 8am -8pm, Tues, Weds: 8am - 5pm; Fri: 8-1 Medicaid - Yes; Tricare -  yes  [facility] Rachel Bo, MD; Laural Benes, MD; Anner Crete, MD; [place], [place]; [place], [place] [address] [place], [place] [address] [phone] M-F 8:30 - 5:00 Medicaid - Call office; Tricare -yes  [facility] Edson Snowball, MD; Shanon Rosser, MD, Chelsea Primus, MD; Shirlyn Goltz, PNP; Wardell Heath, NP [phone] [address] [place], [place] [address] [phone] M-F 8:30 - 5:00, Sat/Sun 8:30 - 12:30 (sick visits) Medicaid - Call office; Tricare -yes  [facility] Melvyn Neth, MD; Karl Luke, PNP; Princess Bruins, MD; [place], [place]; [place], NP; Cynda Familia [address], [place], [place] [address] [phone] M-F 8:30 - 5:00 Medicaid - Call office; Tricare - yes  [facility] Jesusita Oka, MD; Dierdre Highman, MD; Earnest Conroy, MD; Timothy Lasso, MD; [place]ogo, MD [phone] [address], [place], [place] [address] [phone] M-Thur: 8:00 - 5:00; Fri: 8:00 - 4:00 Medicaid - yes; Tricare - yes  [facility] [address] [place], [place] [address] [phone] M-F: 8:30- 5:00, closed for lunch 12:30 - 1:00 Medicaid - yes; Tricare -yes  [facility] [address] [place], [place] [address] [phone] M-F 8:00 - 5:00 Medicaid - yes; Tricare - yes  [facility] [place], DO; [place], DO; Parker, NP [address], [place], [place] [address] [phone] M-F 8:00 - 5:00, Closed 12-1 for lunch Medicaid - Call; Tricare - yes  [facility] Meredith Mody, MD [address] [place], [place] [address] [phone] M-F: 8:00-5:00, Sat: 8:00 - noon Medicaid - call; Tricare -yes  [facility] Pediatric Providers  [facility] [place], [place] [address] [place], [place], [place] [address] [phone] M-W: 8:00-5:00, Thur: 8:00 - 7:00, Fri: 8:00 - noon Medicaid - yes; Tricare - yes  [facility] - Quay Burow, FNP [address], [place],  [place] [address] [phone] M-F 8:00 - 5:00, Closed for lunch 12-1 Medicaid -  yes; Tricare - yes  [facility] Pediatric Providers  [facility], [place], Alinda Money, MD, [place], FNP-C [address], [facility], [address], [place], [place] [address] [phone] M-T 8:00-5:00, Wed-Fri 7:00-6:00 Medicaid - Yes; Tricare -yes  [facility] at [facility], [place]; [address] Saner [place], [place] [address] [phone] M-F 8:00 - 5:00, closed for lunch 12-1 Medicaid - Yes; Tricare - yes  [facility] and Internal Medicine  Zachery Dauer, MD; Gladstone Lighter, MD; Collie Siad, MD; Freda Jackson, MD; Rich Number, MD; Darryl Nestle, MD; Melinda Crutch, MD, Audria Nine, MD; Tawanna Cooler, MD; Steffanie Dunn, MD; Byrd Hesselbach, MD; Lucretia Roers, MD [address], [place], [place] [address] [phone] M-F 8:00-5:00 Medicaid - yes; Tricare - yes  [facility] [place], MD (speaks [place] and Hindi) [address] [place], [place] [address] [phone] M-F: 8:30 - 5:00, closed 12:30 - 1 for lunch Medicaid - Yes; Tricare -yes  [facility] Providers  [facility] Shanda Bumps, MD; Chanetta Marshall, MD; Laurell Josephs, MD [address], [place], [place] [address] [phone] M-Th: 8:00 - 5:30, Fri: 8:00 - 12:00 Medicaid - yes; Tricare - yes  [facility] Pediatrics at [facility], NP; Thora Lance, MD; Orrin Brigham, MD [phone] [address] [place], [place] [address] [phone] M-F: 8:00 - 5:00 Medicaid - yes; Tricare - yes  [facility] [place], NP; Orson Slick, NP; Salley Scarlet, NP; Linton Flemings, MD; Mayford Knife, MD, [place], NP, Emelda Fear, MD; Nida Boatman [address], [place], [place] [address] [phone] M-F: 8:30 - 5:30p Medicaid - yes; Tricare - yes Other locations available as well  [facility], MD; Andrey Campanile, MD; Neville Route, PA-C [address] [place], [place] [address] [phone] M-W: 8:00am - 7:00pm, Thurs: 8:00am - 8:00pm; Fri: 8:00am -  5:00pm, closed daily from 12-1 for lunch Medicaid - yes; Tricare - yes  [facility] Pediatric Providers  [facility] Pediatrics at Levin Erp, MD; Aggie Cosier, FNP; Bland Span, MD; Tristan Schroeder, MD; Byron, PNP; Alesia Banda; [place], [place]; Julian Reil, MD;  [address] [place], [place] [address] [phone] Judie Petit - Caleen Essex: 8am - 5pm, Sat 9-noon Medicaid - Yes; Tricare -yes  [facility] at Jaclynn Guarneri, MD; Yetta Barre, FNP; Lilian Kapur, MD; Mariam Dollar, MD [address] [place], [place] [phone] M-F 8:00 - 5:00 Medicaid - call; Tricare - yes  [facility]- Cruz Condon, MD; [place], [place]; Delora Fuel, MD; Dareen Piano, MD; Trudee Grip, MD; Kizzie Ide, MD; Zebedee Iba; Birdena Crandall, MD; Hinton Dyer, MD; [place], MD [address] [place], [place] [address] [phone] M-F 8:00am - 5:00pm; Sat. 9:00 - 11:00 Medicaid - yes; Tricare - yes  [facility] at [facility], MD [address] [place], [place] [address] [phone] M-F 8:00 - 5:00 Medicaid - [place] Medicaid only; Tricare - yes  [facility] - Illene Bolus, MD; Earlene Plater, [place]; Kenyon Ana, MD [address] [place], [place] [address] [phone] M-F 8:00 - 5:00 Medicaid - yes; Tricare - yes  [facility] [address] Pediatrics - Lind Covert, MD; Manson Passey, MD, [facility], MD, Luther, MD; Barnard, MD; Katrinka Blazing, MD; [address] [place], [place] [address] [phone] M-F: 8-5 Medicaid - yes; Tricare - yes  [facility] - Henrietta Hoover, ; Ong, MD; [address], [place], [place] [address] [phone] M-F 8-5 Medicaid - yes; Tricare - yes  [address] [place] Darrol Poke, MD; Tami Ribas, MD;  [place], MD; [place], DNP; [place], PNP [address], [place], [place] [address] [phone] M-F 8-5 Medicaid - yes; Tricare - yes  [facility] and Pediatrics Delories Heinz, MD;  Adrienne Mocha; Ala Bent, MD [address], [place], [place] [address] [phone] M-F 7am - 5 pm Medicaid - call; Tricare - yes  [facility] [place], [place]; Fredia Beets, MD; Roxan Hockey, MD [address] [place], [place] [address] [phone] M-F 8-5 Medicaid - yes; Tricare - yes  [facility] Kae Heller, MD; Sheliah Hatch, MD; Mayford Knife, FNP; Shon Baton, FNP; Tyron Russell, FNP; Ishmael Holter; [facility] - FNP [address], [place], [place] [address] [phone] M-F 8-5 Medicaid- yes; Tricare - yes  [facility] - Betsy Coder, [place] and Chalmers Guest, MD; Terrial Rhodes, MD; Hulda Humphrey, MD; Roseanne Reno, MD; [place], [place]; Ala Dach, MD; Fredia Beets, MD; Dimple Casey, MD [address] [place], [place] [address] [phone] M-F: 8-5, Sat: 9-4, Sun 9-12 Medicaid - yes; Tricare - yes  [facility] Little, PNP; Earlene Plater, PNP [address] [place], [place] [address] [phone] M-F 8 - 5, closed 12-1 for lunch Medicaid - yes; Tricare - yes  [facility] Kathyrn Lass, MD; Hal Neer, MD; Dimple Casey, MD; [place], DO [address] [place], [place] [address] [phone] M-F 8- 5:30 Medicaid - yes; Tricare - yes  [facility]s - Biagio Quint, MD; Rosalia Hammers, MD; Gwenith Daily, MD [address] [place], [place] [address] [phone] Judie Petit: Nicholas Lose; Tues-Fri: 8-5; Sat: 9-12 Medicaid - yes; Tricare - yes  [facility] - Bobbye Morton, MD; Daphane Shepherd, MD; Chestine Spore, MD; Haskell Riling, MD; Kate Sable, MD [address], [place], [place] [address] [phone] Judie PetitMarland Kitchen Nicholas LoseFrancee Nodal: 8-5; Sat: 8:30-12:30 Medicaid - yes; Tricare - yes  [facility] Heckle [facility] Pediatrics - Beckey Rutter, MD; [place], [place] [address] [place], [place] [address] [phone] Mon-Fri: 8-5 Medicaid - yes;  Tricare - yes  [facility] Pediatrics - [place], CPNP; [place], [place]; Dimple Casey, MD; Alisa Graff, MD; Cephus Shelling, MD; [address], [place], [place] [address] [phone] M-F: 8-5, closed 1-2 for lunch Medicaid - yes; Tricare - yes  [facility]arnelle [facility] - [facility] [place], [place]; [place], [place]; Katrinka Blazing, MD; [place], CPNP; [place], [place]; [place], MD; Earlene Plater, MD [address], [place], [place] [address] [phone] M-Thurs: Nicholas Lose; Fri: 8-6; Sat: 9-12; Sun 2-4 Medicaid - yes; Tricare - yes  [facility] Georgeanna Lea, MD; Evette Cristal, MD; Shea Stakes, FNP; Earney Mallet, DO; [address], [place], [place] [address] [phone] M-F: 8-5 Medicaid - yes; Tricare - yes  [facility] Pediatric Providers  [facility] Family Medicine -Collene Mares, MD; [place], NP [address], [place], [place] [address] [phone] M - Fri: 8am - 5pm, closed for lunch 12-1 Medicaid - Yes; Tricare - yes  [facility] Elinor Parkinson, MD; Victory Dakin, MD; [place], DO; Vinocur, MD;Hall, PA; Clent Ridges, [place]; Orvan Falconer, NP [phone] [address] [place], [place] [address] [phone] M-F 8:00 - 5:00, Sat 8:00 - 11:30 Medicaid - yes; Tricare - yes  [facility] Welton Flakes, MD; [place], MD, [address] MD, [place], MD, [place], MD; [place], NP; [place], [place];  [address], [place], [place] [address] [phone] M-F 8:10am - 5:00pm Medicaid - yes; Tricare - yes  [facility] [place], MD; [place], NP [address] [place], [place] [address] [phone] M-F 8:00 - 5:00 Medicaid - [place] Medicaid only; Tricare - yes  [facility] Family Medicine - Deep [address] [place], [place]; [place], NP [address], [place], [place] [address] [phone] M-F 8:00 - 5:00; Closed for lunch 12 - 1:00 Medicaid - yes;  Tricare - yes  [facility] Belva Crome, MD; Jonita Albee, FNP [address] [place], [place] [address] [phone] Mon 9-5; Tues/Wed 10-5; Thurs 8:30-5; Fri: 8-12:30 Medicaid - yes; Tricare - yes  [facility] Pediatric Providers  [facility]  [place], MD; [place], [place] [address] [place], [place] [address] [phone] phone [phone] fax M-F 7:15 - 4:30 Medicaid - yes; Tricare - yes  [facility] Karilyn Cota, MD; [place], DO [address], [place], [place] [address] [phone] M-Fri: 8:30 - 5:00, closed for lunch everyday noon - 1pm Medicaid - Yes; Tricare - yes  [facility] Burdine, MD; Reuel Boom, MD; Dimas Aguas, MD; Neita Carp, MD; [place], [place]; Bonn[place] Nasuti, [place]; [place], [place]; [place], [place]; [place], [place] [address] [place], [place] [address] [phone] M-Thurs: 7:30am - 7:00pm; Friday 7:30am - 4pm; Sat: 8:00 - 1:00 Medicaid - Yes; Tricare - yes  [facility] Norval Morton, MD; Conni Elliot, MD; Carroll Kinds, MD; Stuart, DO [address], [place], [place] [address] [phone] M-Thur: 8:00 - 5:00, Fri: 8:00 - Noon Medicaid - yes; Tricare - yes No [place] [employer]  [place] [facility] Dettinger, MD; Nadine Counts, DO; [place], NP; Daphine Deutscher, NP; Lequita Halt, NP; Ellamae Sia, NP; Reginia Forts, NP; Darlyn Read, MD; [place], [place] [address], [place], [place] [address] [phone] M-F 8:00 - 5:00 Medicaid - yes; Tricare - yes  [facility], FNP-C; [place], FNP-C [address] [place], [place] [address] [phone] M, W, R 8:00-5:00, Tues: 8:00am - 7:00pm; Fri 8:00 - noon Medicaid - Yes; Tricare - yes  [facility], MD [address] [place], [place] [address] [phone]  M-Thurs 8:30-5:30, Fri: 8:30-12:30pm Medicaid - Yes; Tricare - N   Birth Control Options Birth control is also called contraception. Birth control  prevents pregnancy. There are many types of birth control. Work with your health care provider to find the best option for you. Birth control that uses hormones These types of birth control have hormones in them to prevent pregnancy. Birth control implant This is a small tube that is put into the skin of your arm. The tube can stay in for up to 3 years. Birth control shot These are shots you get every 3 months. Birth control pills This is a pill you take every day. You need to take it at the same time each day. Birth control patch This is a patch that you put on your skin. You change it 1 time each week for 3 weeks. After that, you take the patch off for 1 week. Vaginal ring  This is a soft plastic ring that you put in your vagina. The ring is left in for 3 weeks. Then, you take it out for 1 week. Then, you put a new ring in. Barrier methods  Female condom This is a thin covering that you put on the penis before sex. The condom is thrown away after sex. Female condom This is a soft, loose covering that you put in the vagina before sex.  The condom is thrown away after sex. Diaphragm A diaphragm is a soft barrier that is shaped like a bowl. It must be made to fit your body. You put it in the vagina before sex with a chemical that kills sperm called spermicide. A diaphragm should be left in the vagina for 6-8 hours after sex and taken out within 24 hours. You need to replace a diaphragm: Every 1-2 years. After giving birth. After gaining more than 15 lb (6.8 kg). If you have surgery on your pelvis. Cervical cap This is a small, soft cup that fits over the cervix. The cervix is the lowest part of the uterus. It's put in the vagina before sex, along with spermicide. The cap must be made for you. The cap should be left in for 6-8 hours after sex. It is taken out within 48 hours. A cervical cap must be prescribed and fit to your body by a provider. It should be replaced every 2  years. Sponge This is a small sponge that is put into the vagina before sex. It must be left in for at least 6 hours after sex. It must be taken out within 30 hours and thrown away. Spermicides These are chemicals that kill or stop sperm from getting into the uterus. They may be a pill, cream, jelly, or foam that you put into your vagina. They should be used at least 10-15 minutes before sex. Intrauterine device An intrauterine device (IUD) is a device that's put in the uterus by a provider. There are two types: Hormone IUD. This kind can stay in for 3-5 years. Copper IUD. This kind can stay in for 10 years. Permanent birth control Female tubal ligation This is surgery to block the fallopian tubes. Female sterilization This is a surgery, called a vasectomy, to tie off the tubes that carry sperm in men. This method takes 3 months to work. Other forms of birth control must be used for 3 months. Natural planning methods This means not having sex on the days the female partner could get pregnant. Here are some types of natural planning birth control: Using a calendar: To keep track of the length of each menstrual cycle. To find out what days pregnancy can happen. To plan to not have sex on days when pregnancy can happen. Watching for signs of ovulation and not having sex during this time. The female partner can check for ovulation by keeping track of their temperature each day. They can also look for changes in the mucus that comes from the cervix. Where to find more information Centers for Disease Control and Prevention: TonerPromos.no. Then: Enter "birth control" in the search box. This information is not intended to replace advice given to you by your health care provider. Make sure you discuss any questions you have with your health care provider. Document Revised: 01/25/2023 Document Reviewed: 06/20/2022 Elsevier Patient Education  2024 [employer].

## 2024-01-15 NOTE — Progress Notes (Signed)
   PRENATAL VISIT NOTE  Subjective:  Sherry Barton is a 23 y.o. G1P0 at [redacted]w[redacted]d being seen today for ongoing prenatal care.  She is currently monitored for the following issues for this low-risk pregnancy and has Supervision of other normal pregnancy, antepartum; Alpha thalassemia silent carrier; and Nausea/vomiting in pregnancy on their problem list.  Patient reports no complaints.  Contractions: Not present. Vag. Bleeding: None.  Movement: Present. Denies leaking of fluid.   The following portions of the patient's history were reviewed and updated as appropriate: allergies, current medications, past family history, past medical history, past social history, past surgical history and problem list.   Objective:    Vitals:   01/15/24 0900  BP: 102/63  Pulse: 73    Fetal Status:  Fetal Heart Rate (bpm): 140 Fundal Height: 27 cm Movement: Present    General: Alert, oriented and cooperative. Patient is in no acute distress.  Skin: Skin is warm and dry. No rash noted.   Cardiovascular: Normal heart rate noted  Respiratory: Normal respiratory effort, no problems with respiration noted  Abdomen: Soft, gravid, appropriate for gestational age.  Pain/Pressure: Absent     Pelvic: Cervical exam deferred        Extremities: Normal range of motion.  Edema: None  Mental Status: Normal mood and affect. Normal behavior. Normal judgment and thought content.   Assessment and Plan:  Pregnancy: G1P0 at [redacted]w[redacted]d 1. Supervision of other normal pregnancy, antepartum (Primary) Third trimester labs and glucola today Tdap and flu vaccine today Follow up growth ultrasound for BMI 17 scheduled in October - Glucose Tolerance, 2 Hours w/1 Hour - RPR - CBC - HIV antibody (with reflex) - Tdap vaccine greater than or equal to 7yo IM - Flu vaccine trivalent PF, 6mos and older(Flulaval,Afluria,Fluarix,Fluzone)  2. [redacted] weeks gestation of pregnancy   3. Nausea/vomiting in pregnancy Significantly improved  4.  Alpha thalassemia silent carrier Partner has kit but patient does not believe he will complete testing  Preterm labor symptoms and general obstetric precautions including but not limited to vaginal bleeding, contractions, leaking of fluid and fetal movement were reviewed in detail with the patient. Please refer to After Visit Summary for other counseling recommendations.   Return in about 2 weeks (around 01/29/2024) for in person, ROB, Low risk.  Future Appointments  Date Time Provider Department Center  01/15/2024 10:15 AM Shantika Bermea, [place], MD [facility] None  02/12/2024  2:00 PM [facility] PROVIDER 1 [facility]  02/12/2024  2:15 PM [facility] US2 [facility]    Winton Felt, MD

## 2024-01-15 NOTE — Progress Notes (Signed)
 ROB/GTT.  TDAP  and FLU vaccine given in RD, tolerated well.

## 2024-01-17 LAB — CBC
Hematocrit: 31.6 % — ABNORMAL LOW (ref 34.0–46.6)
Hemoglobin: 9.7 g/dL — ABNORMAL LOW (ref 11.1–15.9)
MCH: 25.3 pg — ABNORMAL LOW (ref 26.6–33.0)
MCHC: 30.7 g/dL — ABNORMAL LOW (ref 31.5–35.7)
MCV: 83 fL (ref 79–97)
Platelets: 290 x10E3/uL (ref 150–450)
RBC: 3.83 x10E6/uL (ref 3.77–5.28)
RDW: 13.5 % (ref 11.7–15.4)
WBC: 6 x10E3/uL (ref 3.4–10.8)

## 2024-01-17 LAB — GLUCOSE TOLERANCE, 2 HOURS W/ 1HR
Glucose, 1 hour: 145 mg/dL (ref 70–179)
Glucose, 2 hour: 129 mg/dL (ref 70–152)
Glucose, Fasting: 72 mg/dL (ref 70–91)

## 2024-01-17 LAB — RPR: RPR Ser Ql: NONREACTIVE

## 2024-01-17 LAB — HIV ANTIBODY (ROUTINE TESTING W REFLEX): HIV Screen 4th Generation wRfx: NONREACTIVE

## 2024-01-21 ENCOUNTER — Ambulatory Visit: Payer: Self-pay | Admitting: Obstetrics and Gynecology

## 2024-01-21 DIAGNOSIS — O99019 Anemia complicating pregnancy, unspecified trimester: Secondary | ICD-10-CM | POA: Insufficient documentation

## 2024-01-21 MED ORDER — ACCRUFER 30 MG PO CAPS: 1.0000 | ORAL_CAPSULE | Freq: Every day | ORAL | 3 refills | Status: DC

## 2024-01-21 NOTE — Addendum Note (Signed)
 Addended by: ALGER GONG on: 01/21/2024 08:34 AM   Modules accepted: Orders

## 2024-01-29 ENCOUNTER — Other Ambulatory Visit (HOSPITAL_COMMUNITY)
Admission: RE | Admit: 2024-01-29 | Discharge: 2024-01-29 | Disposition: A | Discharge status: Home / Self Care | Source: Ambulatory Visit | Attending: Obstetrics & Gynecology | Admitting: Obstetrics & Gynecology

## 2024-01-29 ENCOUNTER — Ambulatory Visit: Admitting: Obstetrics & Gynecology

## 2024-01-29 VITALS — BP 110/70 | HR 96 | Wt 116.0 lb

## 2024-01-29 DIAGNOSIS — Z3480 Encounter for supervision of other normal pregnancy, unspecified trimester: Secondary | ICD-10-CM | POA: Insufficient documentation

## 2024-01-29 NOTE — Progress Notes (Addendum)
 ROB, c/o vaginal discharge, odor x 1 week. Self swab done.

## 2024-01-29 NOTE — Progress Notes (Signed)
   PRENATAL VISIT NOTE  Subjective:  Sherry Barton is a 23 y.o. G1P0 at [redacted]w[redacted]d being seen today for ongoing prenatal care.  She is currently monitored for the following issues for this low-risk pregnancy and has Supervision of other normal pregnancy, antepartum; Alpha thalassemia silent carrier; Nausea/vomiting in pregnancy; and Anemia in pregnancy on their problem list.  Patient reports backache.  Contractions: Not present. Vag. Bleeding: None.  Movement: Present. Denies leaking of fluid.   The following portions of the patient's history were reviewed and updated as appropriate: allergies, current medications, past family history, past medical history, past social history, past surgical history and problem list.   Objective:    Vitals:   01/29/24 1401  BP: 110/70  Pulse: 96  Weight: 116 lb (52.6 kg)    Fetal Status:  Fetal Heart Rate (bpm): 153   Movement: Present    General: Alert, oriented and cooperative. Patient is in no acute distress.  Skin: Skin is warm and dry. No rash noted.   Cardiovascular: Normal heart rate noted  Respiratory: Normal respiratory effort, no problems with respiration noted  Abdomen: Soft, gravid, appropriate for gestational age.  Pain/Pressure: Present     Pelvic: Cervical exam deferred        Extremities: Normal range of motion.  Edema: None  Mental Status: Normal mood and affect. Normal behavior. Normal judgment and thought content.   Assessment and Plan:  Pregnancy: G1P0 at [redacted]w[redacted]d 1. Supervision of other normal pregnancy, antepartum (Primary) Discharge and odor - Cervicovaginal ancillary only( [place])  Preterm labor symptoms and general obstetric precautions including but not limited to vaginal bleeding, contractions, leaking of fluid and fetal movement were reviewed in detail with the patient. Please refer to After Visit Summary for other counseling recommendations.   Return in about 2 weeks (around 02/12/2024).  Future Appointments   Date Time Provider Department Center  02/12/2024  2:00 PM [facility] PROVIDER 1 [facility]  02/12/2024  2:15 PM [facility] US2 [facility]    Lynwood Solomons, MD

## 2024-01-30 LAB — CERVICOVAGINAL ANCILLARY ONLY
Bacterial Vaginitis (gardnerella): POSITIVE — AB
Candida Glabrata: NEGATIVE
Candida Vaginitis: POSITIVE — AB
Comment: NEGATIVE
Comment: NEGATIVE
Comment: NEGATIVE
Comment: NEGATIVE
Trichomonas: NEGATIVE

## 2024-02-05 ENCOUNTER — Encounter (HOSPITAL_COMMUNITY): Payer: Self-pay | Admitting: Obstetrics and Gynecology

## 2024-02-05 ENCOUNTER — Other Ambulatory Visit: Payer: Self-pay

## 2024-02-05 ENCOUNTER — Inpatient Hospital Stay (HOSPITAL_COMMUNITY)
Admission: AD | Admit: 2024-02-05 | Discharge: 2024-02-05 | Disposition: A | Discharge status: Home / Self Care | Attending: Obstetrics and Gynecology | Admitting: Obstetrics and Gynecology

## 2024-02-05 DIAGNOSIS — O23593 Infection of other part of genital tract in pregnancy, third trimester: Secondary | ICD-10-CM | POA: Insufficient documentation

## 2024-02-05 DIAGNOSIS — Z3A31 31 weeks gestation of pregnancy: Secondary | ICD-10-CM | POA: Insufficient documentation

## 2024-02-05 DIAGNOSIS — B9689 Other specified bacterial agents as the cause of diseases classified elsewhere: Secondary | ICD-10-CM | POA: Diagnosis not present

## 2024-02-05 DIAGNOSIS — J069 Acute upper respiratory infection, unspecified: Secondary | ICD-10-CM | POA: Insufficient documentation

## 2024-02-05 DIAGNOSIS — O98513 Other viral diseases complicating pregnancy, third trimester: Secondary | ICD-10-CM | POA: Diagnosis not present

## 2024-02-05 DIAGNOSIS — Z3689 Encounter for other specified antenatal screening: Secondary | ICD-10-CM | POA: Diagnosis not present

## 2024-02-05 DIAGNOSIS — O99513 Diseases of the respiratory system complicating pregnancy, third trimester: Secondary | ICD-10-CM | POA: Diagnosis not present

## 2024-02-05 DIAGNOSIS — O98813 Other maternal infectious and parasitic diseases complicating pregnancy, third trimester: Secondary | ICD-10-CM | POA: Diagnosis not present

## 2024-02-05 DIAGNOSIS — B9789 Other viral agents as the cause of diseases classified elsewhere: Secondary | ICD-10-CM | POA: Diagnosis not present

## 2024-02-05 DIAGNOSIS — B3731 Acute candidiasis of vulva and vagina: Secondary | ICD-10-CM | POA: Diagnosis not present

## 2024-02-05 LAB — RESPIRATORY PANEL BY PCR

## 2024-02-05 LAB — GROUP A STREP BY PCR: Group A Strep by PCR: NOT DETECTED

## 2024-02-05 MED ORDER — METRONIDAZOLE 500 MG PO TABS: 500.0000 mg | ORAL_TABLET | Freq: Two times a day (BID) | ORAL | 0 refills | Status: AC

## 2024-02-05 MED ORDER — FLUCONAZOLE 150 MG PO TABS: 150.0000 mg | ORAL_TABLET | Freq: Once | ORAL | 0 refills | Status: AC

## 2024-02-05 NOTE — MAU Provider Note (Cosign Needed Addendum)
 Chief Complaint:  Nausea, Sore Throat, Cough, and Nasal Congestion   HPI   Sherry Barton is a 23 y.o. G1P0 at [redacted]w[redacted]d presenting with 2-3 days of congestion, cough, and sore throat. She states her nasal discharge has been clear. She has felt nauseous today, but has not thrown up. Denies diarrhea. Her cough is dry. She has some slight pain with swallowing. She has been around her boyfriend's sister who has been sick as well with similar symptoms. However, she does not know what she has.   Patient also mentions that she was diagnosed with BV and a yeast infection at her clinic last week. However, she has not yet gotten medications. She is still experiencing discharge with an odor, and vaginal discomfort.    Pregnancy Course: Receives care at [facility] . Prenatal records reviewed.   Past Medical History:  Diagnosis Date   Chlamydia    OB History  Gravida Para Term Preterm AB Living  1       SAB IAB Ectopic Multiple Live Births          # Outcome Date GA Lbr Len/2nd Weight Sex Type Anes PTL Lv  1 Current            Past Surgical History:  Procedure Laterality Date   NO PAST SURGERIES     Family History  Problem Relation Age of Onset   Healthy Mother    Healthy Father    Social History   Tobacco Use   Smoking status: Never    Passive exposure: Never   Smokeless tobacco: Never  Vaping Use   Vaping status: Never Used  Substance Use Topics   Alcohol use: Never   Drug use: Not Currently    Types: Marijuana    Comment: been a long time, sometime 2024   Allergies  Allergen Reactions   Compazine  [Prochlorperazine ] Other (See Comments)    Pt states it makes her feel off, and she has weird dreams while taking   Medications Prior to Admission  Medication Sig Dispense Refill Last Dose/Taking   Prenatal Vit-Fe Fumarate-FA (MULTIVITAMIN-PRENATAL) 27-0.8 MG TABS tablet Take 1 tablet by mouth daily at 12 noon.   02/05/2024 Morning   Blood Pressure  Monitoring (BLOOD PRESSURE KIT) DEVI 1 kit by Does not apply route once a week. (Patient not taking: Reported on 12/18/2023) 1 each 0    Doxylamine -Pyridoxine  (DICLEGIS ) 10-10 MG TBEC Take 2 tablets by mouth at bedtime. If symptoms persist, add one tablet in the morning and one in the afternoon 100 tablet 5    Doxylamine -Pyridoxine  (DICLEGIS ) 10-10 MG TBEC Take 2 tablets by mouth at bedtime. If symptoms persist, add one tablet in the morning and one in the afternoon 100 tablet 5    Ferric Maltol  (ACCRUFER ) 30 MG CAPS Take 1 capsule (30 mg total) by mouth daily with breakfast. 30 capsule 3    ondansetron  (ZOFRAN -ODT) 4 MG disintegrating tablet Take 1-2 tablets (4-8 mg total) by mouth every 6 (six) hours as needed for nausea or vomiting. 30 tablet 1    potassium chloride  SA (KLOR-CON  M) 20 MEQ tablet Take 1 tablet (20 mEq total) by mouth 2 (two) times daily for 2 days. (Patient not taking: Reported on 12/18/2023) 4 tablet 0    scopolamine  (TRANSDERM-SCOP) 1 MG/3DAYS Place 1 patch (1.5 mg total) onto the skin every 3 (three) days. (Patient not taking: Reported on 01/29/2024) 10 patch 12    scopolamine  (TRANSDERM-SCOP) 1 MG/3DAYS Place 1 patch (1.5  mg total) onto the skin every 3 (three) days. (Patient not taking: Reported on 01/29/2024) 10 patch 12     I have reviewed patient's Past Medical Hx, Surgical Hx, Family Hx, Social Hx, medications and allergies.   ROS  Pertinent items noted in HPI and remainder of comprehensive ROS otherwise negative.   PHYSICAL EXAM  Patient Vitals for the past 24 hrs:  BP Temp Temp src Pulse Resp SpO2 Height Weight  02/05/24 1724 115/70 -- -- 78 -- -- -- --  02/05/24 1434 113/68 -- -- 68 -- 100 % -- --  02/05/24 1407 107/68 98.8 F (37.1 C) Oral 72 19 100 % -- --  02/05/24 1400 -- -- -- -- -- -- 5' 3 (1.6 m) 54.3 kg    Constitutional: Well-developed, ill-appearing female in no acute distress.  HEENT: atraumatic, normocephalic. Neck has normal ROM. EOM grossly intact.  No lymphadenopathy. Throat tender to palpation on left. Swollen tonsils bilaterally, non-erythematous, no exudates. Cardiovascular: normal rate & rhythm, warm and well-perfused Respiratory: normal effort, no problems with respiration noted GI: Abd soft, non-tender, non-distended MSK: Extremities nontender, no edema Skin: warm and dry. Acyanotic, no jaundice or pallor. Neurologic: Alert and oriented x 4.  Psychiatric: Normal mood. Speech not slurred, not rapid/pressured. Patient is cooperative.   Fetal Tracing: Baseline FHR: 130 per minute Fetal heart variability: moderate Fetal Heart Rate accelerations: yes Fetal Heart Rate decelerations: none Fetal Non-stress Test: Category I (reactive) Toco: none   Labs: Results for orders placed or performed during the hospital encounter of 02/05/24 (from the past 24 hours)  Respiratory (~20 pathogens) panel by PCR     Status: None   Collection Time: 02/05/24  2:25 PM   Specimen: Nasopharyngeal Swab; Respiratory  Result Value Ref Range   Adenovirus NOT DETECTED NOT DETECTED   Coronavirus 229E NOT DETECTED NOT DETECTED   Coronavirus HKU1 NOT DETECTED NOT DETECTED   Coronavirus NL63 NOT DETECTED NOT DETECTED   Coronavirus OC43 NOT DETECTED NOT DETECTED   Metapneumovirus NOT DETECTED NOT DETECTED   Rhinovirus / Enterovirus NOT DETECTED NOT DETECTED   Influenza A NOT DETECTED NOT DETECTED   Influenza B NOT DETECTED NOT DETECTED   Parainfluenza Virus 1 NOT DETECTED NOT DETECTED   Parainfluenza Virus 2 NOT DETECTED NOT DETECTED   Parainfluenza Virus 3 NOT DETECTED NOT DETECTED   Parainfluenza Virus 4 NOT DETECTED NOT DETECTED   Respiratory Syncytial Virus NOT DETECTED NOT DETECTED   Bordetella pertussis NOT DETECTED NOT DETECTED   Bordetella Parapertussis NOT DETECTED NOT DETECTED   Chlamydophila pneumoniae NOT DETECTED NOT DETECTED   Mycoplasma pneumoniae NOT DETECTED NOT DETECTED  Group A Strep by PCR     Status: None   Collection Time:  02/05/24  2:25 PM   Specimen: Throat; Sterile Swab  Result Value Ref Range   Group A Strep by PCR NOT DETECTED NOT DETECTED    MDM & MAU COURSE  MDM: High  MAU Course: Patient presented with viral URI symptoms. Afebrile, normotensive.  Strep and respiratory panels were both negative.  Patient counseled that this is most likely a cold or allergies. Advised to rest and take in adequate fluids.  List of safe medications in pregnancy given.  Cervicovaginal swab positive for BV and yeast on 01/29/24. Patient does not have medication, and is still having symptoms. Prescriptions sent for both.  Patient sent home in stable condition, return precautions given.   Differential diagnosis considered for cough includes but is not limited to: viral respiratory tract  infection, heart failure, bacterial pneumonia, asthma, COPD, bronchiolitis, foreign body and sore throat includes but is not limited to: viral pharyngitis, strep pharyngitis, tonsillitis, peritonsillar abscess, retropharyngeal abscess, foreign body, mononucleosis    Orders Placed This Encounter  Procedures   Respiratory (~20 pathogens) panel by PCR   Group A Strep by PCR   Droplet precaution   Discharge patient   Meds ordered this encounter  Medications   metroNIDAZOLE  (FLAGYL ) 500 MG tablet    Sig: Take 1 tablet (500 mg total) by mouth 2 (two) times daily for 7 days. Do not drink alcohol while taking this medication.    Dispense:  14 tablet    Refill:  0   fluconazole (DIFLUCAN) 150 MG tablet    Sig: Take 1 tablet (150 mg total) by mouth once for 1 dose. Take after you finish your Metronidazole  (Flagyl ).    Dispense:  1 tablet    Refill:  0    ASSESSMENT   1. Viral upper respiratory tract infection   2. [redacted] weeks gestation of pregnancy   3. NST (non-stress test) reactive   4. Bacterial vaginosis   5. Vaginal yeast infection     PLAN  Discharge home in stable condition with return precautions.   Viral URI - Advised  patient to rest and take in adequate fluids - List of medications safe in pregnancy given - Follow-up with prenatal care as scheduled    Bacterial vaginosis and candida vaginitis   - Metronidazole  500 mg BID for 7 days  - Fluconazole 150 mg once    Allergies as of 02/05/2024       Reactions   Compazine  [prochlorperazine ] Other (See Comments)   Pt states it makes her feel off, and she has weird dreams while taking        Medication List     STOP taking these medications    potassium chloride  SA 20 MEQ tablet Commonly known as: KLOR-CON  M       TAKE these medications    ACCRUFeR  30 MG Caps Generic drug: Ferric Maltol  Take 1 capsule (30 mg total) by mouth daily with breakfast.   Blood Pressure Kit Devi 1 kit by Does not apply route once a week.   Doxylamine -Pyridoxine  10-10 MG Tbec Commonly known as: Diclegis  Take 2 tablets by mouth at bedtime. If symptoms persist, add one tablet in the morning and one in the afternoon What changed: Another medication with the same name was removed. Continue taking this medication, and follow the directions you see here.   fluconazole 150 MG tablet Commonly known as: Diflucan Take 1 tablet (150 mg total) by mouth once for 1 dose. Take after you finish your Metronidazole  (Flagyl ).   metroNIDAZOLE  500 MG tablet Commonly known as: FLAGYL  Take 1 tablet (500 mg total) by mouth 2 (two) times daily for 7 days. Do not drink alcohol while taking this medication.   multivitamin-prenatal 27-0.8 MG Tabs tablet Take 1 tablet by mouth daily at 12 noon.   ondansetron  4 MG disintegrating tablet Commonly known as: ZOFRAN -ODT Take 1-2 tablets (4-8 mg total) by mouth every 6 (six) hours as needed for nausea or vomiting.   scopolamine  1 MG/3DAYS Commonly known as: TRANSDERM-SCOP Place 1 patch (1.5 mg total) onto the skin every 3 (three) days. What changed: Another medication with the same name was removed. Continue taking this medication, and  follow the directions you see here.        Raguel KANDICE Lee, DO   Attestation of  Supervision of Student:  I confirm that I have verified the information documented in the resident's note and that I have also personally reperformed the history, physical exam and all medical decision making activities.  I have verified that all services and findings are accurately documented in this student's note; and I agree with management and plan as outlined in the documentation. I have also made any necessary editorial changes.   Dario Yono, CNM Center for [employer], [facility] 02/05/2024 7:10 PM

## 2024-02-05 NOTE — MAU Note (Addendum)
 MAU Triage Note  Sherry Barton is a 23 y.o. at [redacted]w[redacted]d here in MAU reporting: reports a sore throat, coughing, and nasal congestion for the past 2-3 days. Also reports some nausea this morning, but hasn't had an emesis occurrence. Reports she hasn't been tested for any respiratory illnesses. She has been in contact with someone who is sick, however, she is unsure of what they've been Dx with as they've just got checked out today. Endorses +FM, denies VB and LOF.    Onset of complaint: past 2-3 days Pain score: denies Vitals:   02/05/24 1407  BP: 107/68  Pulse: 72  Resp: 19  Temp: 98.8 F (37.1 C)  SpO2: 100%     FHT: 132  Lab orders placed from triage:

## 2024-02-05 NOTE — Discharge Instructions (Signed)
 If your symptoms do not improve over the next week, or if you start to have a fever/chills, come back to be evaluated.    Safe Medications in Pregnancy   Acne: Benzoyl Peroxide Salicylic Acid  Backache/Headache: Tylenol : 2 regular strength every 4 hours OR              2 Extra strength every 6 hours  Colds/Coughs/Allergies: Benadryl  (alcohol free) 25 mg every 6 hours as needed Breath right strips Claritin Cepacol throat lozenges Chloraseptic throat spray Cold-Eeze- up to three times per day Cough drops, alcohol free Flonase (by prescription only) Guaifenesin Mucinex Robitussin DM (plain only, alcohol free) Saline nasal spray/drops Sudafed (pseudoephedrine) & Actifed ** use only after [redacted] weeks gestation and if you do not have high blood pressure Tylenol  Vicks Vaporub Zinc lozenges Zyrtec   Constipation: Colace Ducolax suppositories Fleet enema Glycerin suppositories Metamucil Milk of magnesia Miralax Senokot Smooth move tea  Diarrhea: Kaopectate Imodium A-D  *NO pepto Bismol  Hemorrhoids: Anusol Anusol HC Preparation H Tucks  Indigestion: Tums Maalox Mylanta Zantac  Pepcid   Insomnia: Benadryl  (alcohol free) 25mg  every 6 hours as needed Tylenol  PM Unisom , no Gelcaps  Leg Cramps: Tums MagGel  Nausea/Vomiting:  Bonine Dramamine Emetrol Ginger extract Sea bands Meclizine  Nausea medication to take during pregnancy:  Unisom  (doxylamine  succinate 25 mg tablets) Take one tablet daily at bedtime. If symptoms are not adequately controlled, the dose can be increased to a maximum recommended dose of two tablets daily (1/2 tablet in the morning, 1/2 tablet mid-afternoon and one at bedtime). Vitamin B6 100mg  tablets. Take one tablet twice a day (up to 200 mg per day).  Skin Rashes: Aveeno products Benadryl  cream or 25mg  every 6 hours as needed Calamine Lotion 1% cortisone cream  Yeast infection: Gyne-lotrimin 7 Monistat 7   **If taking  multiple medications, please check labels to avoid duplicating the same active ingredients **take medication as directed on the label ** Do not exceed 4000 mg of tylenol  in 24 hours **Do not take medications that contain aspirin or ibuprofen 

## 2024-02-12 ENCOUNTER — Ambulatory Visit

## 2024-02-12 ENCOUNTER — Ambulatory Visit: Attending: Obstetrics and Gynecology | Admitting: Obstetrics

## 2024-02-12 DIAGNOSIS — Z3A32 32 weeks gestation of pregnancy: Secondary | ICD-10-CM

## 2024-02-12 DIAGNOSIS — Z148 Genetic carrier of other disease: Secondary | ICD-10-CM | POA: Insufficient documentation

## 2024-02-12 DIAGNOSIS — Z362 Encounter for other antenatal screening follow-up: Secondary | ICD-10-CM | POA: Diagnosis not present

## 2024-02-12 DIAGNOSIS — O99013 Anemia complicating pregnancy, third trimester: Secondary | ICD-10-CM

## 2024-02-12 DIAGNOSIS — D563 Thalassemia minor: Secondary | ICD-10-CM | POA: Diagnosis not present

## 2024-02-12 DIAGNOSIS — O2613 Low weight gain in pregnancy, third trimester: Secondary | ICD-10-CM

## 2024-02-12 DIAGNOSIS — R636 Underweight: Secondary | ICD-10-CM

## 2024-02-12 DIAGNOSIS — Z3483 Encounter for supervision of other normal pregnancy, third trimester: Secondary | ICD-10-CM

## 2024-02-12 NOTE — Progress Notes (Signed)
 MFM Consult Note  Sherry Barton is currently at 32 weeks and 3 days.  She has been followed due to a low maternal BMI (17.7).  She denies any problems since her last exam.  Sonographic findings Single intrauterine pregnancy at 32w 3d.  Fetal cardiac activity:  Observed and appears normal. Presentation: Cephalic. Interval fetal anatomy appears normal. Fetal biometry shows the estimated fetal weight of 4 pounds 1 ounces which measures at the 23rd percentile. Amniotic fluid volume: Within normal limits. MVP: 4.92 cm. Placenta: Posterior.  There are limitations of prenatal ultrasound such as the inability to detect certain abnormalities due to poor visualization. Various factors such as fetal position, gestational age and maternal body habitus may increase the difficulty in visualizing the fetal anatomy.    As the fetal growth is within normal limits, no further exams were scheduled in our office.

## 2024-02-13 ENCOUNTER — Encounter: Payer: Self-pay | Admitting: Obstetrics and Gynecology

## 2024-02-13 ENCOUNTER — Ambulatory Visit (INDEPENDENT_AMBULATORY_CARE_PROVIDER_SITE_OTHER): Admitting: Obstetrics and Gynecology

## 2024-02-13 VITALS — BP 120/70 | HR 74 | Wt 120.4 lb

## 2024-02-13 DIAGNOSIS — Z3A32 32 weeks gestation of pregnancy: Secondary | ICD-10-CM | POA: Diagnosis not present

## 2024-02-13 DIAGNOSIS — O99013 Anemia complicating pregnancy, third trimester: Secondary | ICD-10-CM

## 2024-02-13 DIAGNOSIS — Z3480 Encounter for supervision of other normal pregnancy, unspecified trimester: Secondary | ICD-10-CM

## 2024-02-13 NOTE — Progress Notes (Signed)
 Pt positive for BV, still taking meds.   Pt has questions concerning pediatrician following birth. Would like to discuss with provider.

## 2024-02-13 NOTE — Progress Notes (Signed)
   PRENATAL VISIT NOTE  Subjective:  Lucielle Vokes is a 23 y.o. G1P0 at [redacted]w[redacted]d being seen today for ongoing prenatal care.  She is currently monitored for the following issues for this low-risk pregnancy and has Supervision of other normal pregnancy, antepartum; Alpha thalassemia silent carrier; Nausea/vomiting in pregnancy; and Anemia in pregnancy on their problem list.  Patient reports questions about doula, has tried to call the office and not getting response .  Contractions: Irritability. Vag. Bleeding: None.  Movement: Increased. Denies leaking of fluid.   The following portions of the patient's history were reviewed and updated as appropriate: allergies, current medications, past family history, past medical history, past social history, past surgical history and problem list.   Objective:    Vitals:   02/13/24 1607  BP: 120/70  Pulse: 74  Weight: 120 lb 6.4 oz (54.6 kg)    Fetal Status:  Fetal Heart Rate (bpm): 136 Fundal Height: 32 cm Movement: Increased    General: Alert, oriented and cooperative. Patient is in no acute distress.  Skin: Skin is warm and dry. No rash noted.   Cardiovascular: Normal heart rate noted  Respiratory: Normal respiratory effort, no problems with respiration noted  Abdomen: Soft, gravid, appropriate for gestational age.  Pain/Pressure: Present     Pelvic: Cervical exam deferred        Extremities: Normal range of motion.  Edema: None  Mental Status: Normal mood and affect. Normal behavior. Normal judgment and thought content.   Assessment and Plan:  Pregnancy: G1P0 at [redacted]w[redacted]d 1. Supervision of other normal pregnancy, antepartum (Primary) BP and FHR normal Doing well, feeling regular movement    2. [redacted] weeks gestation of pregnancy Doula info sent  Discussion about braxton hicks Apologized for not being able to get in touch with office   3. Anemia during pregnancy in third trimester Continue oral iron  Recheck cbc next visit  Has decided  on peds, encouraged to call and make sure accepting   Preterm labor symptoms and general obstetric precautions including but not limited to vaginal bleeding, contractions, leaking of fluid and fetal movement were reviewed in detail with the patient. Please refer to After Visit Summary for other counseling recommendations.   Return in about 2 weeks (around 02/27/2024) for OB VISIT (MD or APP).    Nidia Daring, FNP

## 2024-02-13 NOTE — Patient Instructions (Signed)
 Options for Doula Care in the [place]  As you review your birthing options, consider having a birth doula. A doula is trained to provide support before, during and just after you give birth. There are also postpartum doulas that help you adjust to new parenthood.  While doulas do not provide medical care, they do provide emotional, physical and educational support. A few months before your baby arrives, doulas can help answer questions, ease concerns and help you create and support your birthing plan.    Doulas can help reduce your stress and comfort you and your partner. They can help you cope with labor by helping you use breathing techniques, massage, creative labor positioning, essential oils and affirmations.   Studies show that the benefits of having a doula include:   A more positive birth experience  Fewer requests for pain-relief medication  Less likelihood of cesarean section, commonly called a c-section   Doulas are typically hired via a [employer] account planner between you and the doula. We are happy to provide a list of the most active doulas in the area, all of whom are credentialed by [facility] and will not count as a visitor at your birth.  There are several options for no-cost doula care at our hospital, including:  [facility] Volunteer Doula Program Every [employer] Program A Cure 4 Moms Doula Study (available only at [employer] for [facility], [place], [place] and [facility] offices)  For more information on these programs or to receive a list of doulas active in our area, please email [email or web address]

## 2024-02-27 ENCOUNTER — Ambulatory Visit: Admitting: Obstetrics & Gynecology

## 2024-02-27 VITALS — BP 122/81 | HR 86 | Wt 125.0 lb

## 2024-02-27 DIAGNOSIS — Z3480 Encounter for supervision of other normal pregnancy, unspecified trimester: Secondary | ICD-10-CM

## 2024-02-27 DIAGNOSIS — Z3A34 34 weeks gestation of pregnancy: Secondary | ICD-10-CM | POA: Diagnosis not present

## 2024-02-27 NOTE — Progress Notes (Signed)
   PRENATAL VISIT NOTE  Subjective:  Sherry Barton is a 23 y.o. G1P0 at [redacted]w[redacted]d being seen today for ongoing prenatal care.  She is currently monitored for the following issues for this high-risk pregnancy and has Supervision of other normal pregnancy, antepartum; Alpha thalassemia silent carrier; Nausea/vomiting in pregnancy; and Anemia in pregnancy on their problem list.  Patient reports headache and rare heartburn.  Contractions: Not present. Vag. Bleeding: None.  Movement: Present. Denies leaking of fluid.  Some left side pain The following portions of the patient's history were reviewed and updated as appropriate: allergies, current medications, past family history, past medical history, past social history, past surgical history and problem list.   Objective:    Vitals:   02/27/24 1358  BP: 122/81  Pulse: 86  Weight: 125 lb (56.7 kg)    Fetal Status:  Fetal Heart Rate (bpm): 147   Movement: Present    General: Alert, oriented and cooperative. Patient is in no acute distress.  Skin: Skin is warm and dry. No rash noted.   Cardiovascular: Normal heart rate noted  Respiratory: Normal respiratory effort, no problems with respiration noted  Abdomen: Soft, gravid, appropriate for gestational age.  Pain/Pressure: Present     Pelvic: Cervical exam deferred        Extremities: Normal range of motion.  Edema: None  Mental Status: Normal mood and affect. Normal behavior. Normal judgment and thought content.   Assessment and Plan:  Pregnancy: G1P0 at [redacted]w[redacted]d 1. [redacted] weeks gestation of pregnancy (Primary) Normal fetal growth  2. Supervision of other normal pregnancy, antepartum   Preterm labor symptoms and general obstetric precautions including but not limited to vaginal bleeding, contractions, leaking of fluid and fetal movement were reviewed in detail with the patient. Please refer to After Visit Summary for other counseling recommendations.   Return in about 2 weeks (around  03/12/2024).  No future appointments.  Lynwood Solomons, MD

## 2024-02-27 NOTE — Progress Notes (Signed)
 ROB, c/o BP dropping, HA 10/10, pain in L side 8/10 x 1+ Weeks

## 2024-03-10 ENCOUNTER — Telehealth (HOSPITAL_COMMUNITY): Payer: Self-pay | Admitting: Family Medicine

## 2024-03-10 ENCOUNTER — Encounter (HOSPITAL_COMMUNITY): Payer: Self-pay | Admitting: Obstetrics and Gynecology

## 2024-03-10 ENCOUNTER — Encounter (HOSPITAL_COMMUNITY): Payer: Self-pay

## 2024-03-10 ENCOUNTER — Inpatient Hospital Stay (HOSPITAL_COMMUNITY)
Admission: AD | Admit: 2024-03-10 | Discharge: 2024-03-10 | Disposition: A | Discharge status: Home / Self Care | Attending: Obstetrics and Gynecology | Admitting: Obstetrics and Gynecology

## 2024-03-10 ENCOUNTER — Telehealth: Payer: Self-pay | Admitting: Pharmacy Technician

## 2024-03-10 ENCOUNTER — Other Ambulatory Visit: Payer: Self-pay

## 2024-03-10 DIAGNOSIS — O99013 Anemia complicating pregnancy, third trimester: Secondary | ICD-10-CM | POA: Insufficient documentation

## 2024-03-10 DIAGNOSIS — B9689 Other specified bacterial agents as the cause of diseases classified elsewhere: Secondary | ICD-10-CM

## 2024-03-10 DIAGNOSIS — O99323 Drug use complicating pregnancy, third trimester: Secondary | ICD-10-CM | POA: Diagnosis present

## 2024-03-10 DIAGNOSIS — R1030 Lower abdominal pain, unspecified: Secondary | ICD-10-CM | POA: Diagnosis not present

## 2024-03-10 DIAGNOSIS — O23593 Infection of other part of genital tract in pregnancy, third trimester: Secondary | ICD-10-CM

## 2024-03-10 DIAGNOSIS — Z3A36 36 weeks gestation of pregnancy: Secondary | ICD-10-CM

## 2024-03-10 DIAGNOSIS — O4703 False labor before 37 completed weeks of gestation, third trimester: Secondary | ICD-10-CM | POA: Insufficient documentation

## 2024-03-10 LAB — COMPREHENSIVE METABOLIC PANEL WITH GFR
ALT: 8 U/L (ref 0–44)
AST: 16 U/L (ref 15–41)
Albumin: 2.7 g/dL — ABNORMAL LOW (ref 3.5–5.0)
Alkaline Phosphatase: 111 U/L (ref 38–126)
Anion gap: 9 (ref 5–15)
BUN: 8 mg/dL (ref 6–20)
CO2: 22 mmol/L (ref 22–32)
Calcium: 8.9 mg/dL (ref 8.9–10.3)
Chloride: 105 mmol/L (ref 98–111)
Creatinine, Ser: 0.6 mg/dL (ref 0.44–1.00)
GFR, Estimated: >60 mL/min (ref >60)
Glucose, Bld: 88 mg/dL (ref 70–99)
Potassium: 3.7 mmol/L (ref 3.5–5.1)
Sodium: 136 mmol/L (ref 135–145)
Total Bilirubin: 0.7 mg/dL (ref 0.0–1.2)
Total Protein: 6.2 g/dL — ABNORMAL LOW (ref 6.5–8.1)

## 2024-03-10 LAB — GC/CHLAMYDIA PROBE AMP (~~LOC~~) NOT AT ARMC
Chlamydia: NEGATIVE
Comment: NEGATIVE
Comment: NORMAL
Neisseria Gonorrhea: NEGATIVE

## 2024-03-10 LAB — CBC
HCT: 28.4 % — ABNORMAL LOW (ref 36.0–46.0)
Hemoglobin: 8.7 g/dL — ABNORMAL LOW (ref 12.0–15.0)
MCH: 24 pg — ABNORMAL LOW (ref 26.0–34.0)
MCHC: 30.6 g/dL (ref 30.0–36.0)
MCV: 78.2 fL — ABNORMAL LOW (ref 80.0–100.0)
Platelets: 236 K/uL (ref 150–400)
RBC: 3.63 MIL/uL — ABNORMAL LOW (ref 3.87–5.11)
RDW: 15.1 % (ref 11.5–15.5)
WBC: 5.6 K/uL (ref 4.0–10.5)
nRBC: 0.4 % — ABNORMAL HIGH (ref 0.0–0.2)

## 2024-03-10 LAB — URINALYSIS, ROUTINE W REFLEX MICROSCOPIC
Bilirubin Urine: NEGATIVE
Glucose, UA: NEGATIVE mg/dL
Hgb urine dipstick: NEGATIVE
Ketones, ur: NEGATIVE mg/dL
Leukocytes,Ua: NEGATIVE
Nitrite: NEGATIVE
Protein, ur: NEGATIVE mg/dL
Specific Gravity, Urine: 1.012 (ref 1.005–1.030)
pH: 6 (ref 5.0–8.0)

## 2024-03-10 LAB — WET PREP, GENITAL
Sperm: NONE SEEN
Trich, Wet Prep: NONE SEEN
WBC, Wet Prep HPF POC: >=10 — AB (ref <10)
Yeast Wet Prep HPF POC: NONE SEEN

## 2024-03-10 LAB — PROTEIN / CREATININE RATIO, URINE
Creatinine, Urine: 97 mg/dL
Protein Creatinine Ratio: 0.08 mg/mg{creat} (ref 0.00–0.15)
Total Protein, Urine: 8 mg/dL

## 2024-03-10 MED ORDER — ACETAMINOPHEN 500 MG PO TABS
1000.0000 mg | ORAL_TABLET | Freq: Once | ORAL | Status: AC
  Administered 2024-03-10: 1000 mg via ORAL
  Filled 2024-03-10: qty 2

## 2024-03-10 MED ORDER — METRONIDAZOLE 500 MG PO TABS: 500.0000 mg | ORAL_TABLET | Freq: Two times a day (BID) | ORAL | 0 refills | Status: AC

## 2024-03-10 MED ORDER — CYCLOBENZAPRINE HCL 5 MG PO TABS
10.0000 mg | ORAL_TABLET | Freq: Once | ORAL | Status: AC
  Administered 2024-03-10: 10 mg via ORAL
  Filled 2024-03-10: qty 2

## 2024-03-10 NOTE — Discharge Instructions (Addendum)
 Reasons to return to MAU at [facility]: Less than 36 weeks: Contractions feels like menstrual cramps. You should go to the hospital if you have more than 6 contractions in an hour, even after you have rested and drank at least 16 ounces of water.  More than 36 weeks: You begin to have strong, frequent contractions 5 minutes apart or less, each last 1 minute, these have been going on for 1-2 hours, and you cannot walk or talk during them. Your water breaks.  Sometimes it is a big gush of fluid. However, many times it may it may be much more subtle. You should go to the hospital if you have a constant leakage of fluid from your vagina, enough to soak a pad when you are walking around.  You have vaginal bleeding.  It is normal to have a small amount of spotting if your cervix was checked. If you have bleeding requiring the use of a pad, go to the hospital. You don't feel your baby moving like normal.  If you think that you baby's movement is decreased, eat a snack and rest on your left side in a quiet room for one hour. If you have not felt the baby move more than 6 times in an hour GO TO THE HOSPITAL.     BACTERIAL VAGINOSIS:  You have an overgrowth of bacteria in your vagina called bacterial vaginosis. This is not a sexually transmitted infection.  Sexual partners do not need to be treated, however abstaining from sex or using condoms may prevent recurrence of the overgrowth. Some women have a recurrence of the overgrowth even when fully treated.  Call the office if your symptoms begin again.  Do not douche as this is associated with decreased cure rates and more bacterial overgrowths. Take the full course of the antibiotic prescribed to you even if you begin to feel better.  You may also be experiencing round ligament pain, which is very common during pregnancy.   Round Ligament Pain During Pregnancy Round ligament pain is a sharp pain or jabbing feeling often felt in the  lower belly or groin area on one or both sides. It is one of the most common complaints during pregnancy and is considered a normal part of pregnancy. It is most often felt during the second trimester.  Here is what you need to know about round ligament pain, including some tips to help you feel better.  Causes of Round Ligament Pain  Several thick ligaments surround and support your womb (uterus) as it grows during pregnancy. One of them is called the round ligament.  The round ligament connects the front part of the womb to your groin, the area where your legs attach to your pelvis. The round ligament normally tightens and relaxes slowly.  As your baby and womb grow, the round ligament stretches. That makes it more likely to become strained.  Sudden movements can cause the ligament to tighten quickly, like a rubber band snapping. This causes a sudden and quick jabbing feeling.  Symptoms of Round Ligament Pain  Round ligament pain can be concerning and uncomfortable. But it is considered normal as your body changes during pregnancy.  The symptoms of round ligament pain include a sharp, sudden spasm in the belly. It usually affects the right side, but it may happen on both sides. The pain only lasts a few seconds.  Exercise may cause the pain, as will rapid movements such as:  sneezing coughing laughing  rolling over in bed standing up too quickly  Comfort Measures: Soak in a warm tub Tylenol  1000 mg by mouth every 6-8 hrs as needed for pain Slow position changes Laying on the affected side    Massage: Starting at the middle of your pubic bone, trace little circles in a wide U from your pubic bone to your hip bones on both sides.  Then starting just above your pubic bone, press in and down, alternating sides to create a gentle rocking of your uterus back and forth.  Move your hands up the sides of your belly and back down. Do this 3-5 times upon waking and before bed.    Stretches: Get on hands and knees and alternate arching your back deeply while inhaling, and then rounding your back while exhaling. Modified runners lunge:  - Sit on a chair with half of your bottom on the chair and half off.  - Sit up tall, plant your front foot, and stretch your other foot out behind you.  - Breathe deeply for 5 breaths and then do the other side.   Also chiropractors and massages are safe in pregnancy. [facility] in [place] specializes in pregnancy care. You can visit their website at: [email or web address] to schedule a new patient chiropractic treatment (1 hour) appointment. Please let us  know if you have any other questions or concerns.  Maternity Support [employer] can purchase on Amazon or through [place]. Below is an example.

## 2024-03-10 NOTE — Telephone Encounter (Signed)
 Auth Submission: NO AUTH NEEDED Site of care: Site of care: [facility] Payer: medicaid Medication & CPT/J Code(s) submitted: monoferric J1437 Diagnosis Code: D50.9 Route of submission (phone, fax, portal):  Phone # Fax # Auth type: Buy/Bill HB Units/visits requested: X1 DOSE Reference number:  Approval from: 03/10/24 to 05/07/24

## 2024-03-10 NOTE — MAU Provider Note (Signed)
 Chief Complaint:  Abdominal Pain   HPI   None     Sherry Barton is a 23 y.o. G1P0 at [redacted]w[redacted]d who presents to maternity admissions reporting abdominal pain.  She reports lower abdominal pain started 2 days ago and has been constant and sharp since then.  Pain is currently 9/10 intensity.  She notes that certain movements or positions make the pain worse. She has not tried any pain medicine or heat at home. Denies nausea, vomiting, diarrhea, dysuria, contractions.  She endorses some vaginal discharge 2 days ago without odor or vaginal irritation.  Denies vaginal bleeding, leaking of fluid.  Endorses good fetal movement.  Additional history obtained from mother  Pregnancy Course: Receives care at [facility] for [employer] . Prenatal records reviewed.  Pregnancy complicated by anemia.  Past Medical History:  Diagnosis Date   Chlamydia    OB History  Gravida Para Term Preterm AB Living  1       SAB IAB Ectopic Multiple Live Births          # Outcome Date GA Lbr Len/2nd Weight Sex Type Anes PTL Lv  1 Current            Past Surgical History:  Procedure Laterality Date   NO PAST SURGERIES     Family History  Problem Relation Age of Onset   Healthy Mother    Healthy Father    Social History   Tobacco Use   Smoking status: Never    Passive exposure: Never   Smokeless tobacco: Never  Vaping Use   Vaping status: Never Used  Substance Use Topics   Alcohol use: Never   Drug use: Not Currently    Types: Marijuana    Comment: been a long time, sometime 2024   Allergies  Allergen Reactions   Compazine  [Prochlorperazine ] Other (See Comments)    Pt states it makes her feel off, and she has weird dreams while taking   Medications Prior to Admission  Medication Sig Dispense Refill Last Dose/Taking   Ferric Maltol  (ACCRUFER ) 30 MG CAPS Take 1 capsule (30 mg total) by mouth daily with breakfast. 30 capsule 3 03/09/2024   ondansetron  (ZOFRAN -ODT) 4 MG disintegrating  tablet Take 1-2 tablets (4-8 mg total) by mouth every 6 (six) hours as needed for nausea or vomiting. 30 tablet 1 Past Month   Prenatal Vit-Fe Fumarate-FA (MULTIVITAMIN-PRENATAL) 27-0.8 MG TABS tablet Take 1 tablet by mouth daily at 12 noon.   03/09/2024   Blood Pressure Monitoring (BLOOD PRESSURE KIT) DEVI 1 kit by Does not apply route once a week. (Patient not taking: Reported on 12/18/2023) 1 each 0    Doxylamine -Pyridoxine  (DICLEGIS ) 10-10 MG TBEC Take 2 tablets by mouth at bedtime. If symptoms persist, add one tablet in the morning and one in the afternoon (Patient not taking: Reported on 02/13/2024) 100 tablet 5    Ferrous Sulfate (IRON PO) Take 1 tablet by mouth daily.      scopolamine  (TRANSDERM-SCOP) 1 MG/3DAYS Place 1 patch (1.5 mg total) onto the skin every 3 (three) days. (Patient not taking: Reported on 01/29/2024) 10 patch 12     I have reviewed patient's Past Medical Hx, Surgical Hx, Family Hx, Social Hx, medications and allergies.   ROS  Pertinent items noted in HPI and remainder of comprehensive ROS otherwise negative.   PHYSICAL EXAM  Patient Vitals for the past 24 hrs:  BP Temp Temp src Pulse Resp SpO2 Height Weight  03/10/24 0131 123/75 -- -- 85 -- -- -- --  03/10/24 0128 132/78 -- -- 68 -- 99 % -- --  03/10/24 0117 (!) 144/81 98.8 F (37.1 C) Oral 70 18 99 % 5' 3 (1.6 m) 57.6 kg    Constitutional: Well-developed, well-nourished female in no acute distress.  HEENT: atraumatic, normocephalic. Neck has normal ROM. EOM intact. Cardiovascular: normal rate & rhythm, warm and well-perfused Respiratory: normal effort, no problems with respiration noted GI: Abd soft, non-tender, non-distended MSK: Extremities nontender, no edema, normal ROM Skin: warm and dry. Acyanotic, no jaundice or pallor. Neurologic: Alert and oriented x 4. No abnormal coordination. Psychiatric: Normal mood. Speech not slurred, not rapid/pressured. Patient is cooperative. GU: no CVA tenderness Pelvic  exam: exam chaperoned by Derrek Freund RN.  Dilation: Closed Effacement (%): Thick Cervical Position: Posterior Station: -3 Exam by:: Joesph Sear PA-C  Fetal Tracing: Baseline FHR: 130 per minute Fetal heart variability: moderate Fetal Heart Rate accelerations: yes Fetal Heart Rate decelerations: none Fetal Non-stress Test: Category I (reactive) Toco: occasional uterine contractions. Patient states she does not feel them.  Labs: Results for orders placed or performed during the hospital encounter of 03/10/24 (from the past 24 hours)  Urinalysis, Routine w reflex microscopic -Urine, Clean Catch     Status: None   Collection Time: 03/10/24  1:25 AM  Result Value Ref Range   Color, Urine YELLOW YELLOW   APPearance CLEAR CLEAR   Specific Gravity, Urine 1.012 1.005 - 1.030   pH 6.0 5.0 - 8.0   Glucose, UA NEGATIVE NEGATIVE mg/dL   Hgb urine dipstick NEGATIVE NEGATIVE   Bilirubin Urine NEGATIVE NEGATIVE   Ketones, ur NEGATIVE NEGATIVE mg/dL   Protein, ur NEGATIVE NEGATIVE mg/dL   Nitrite NEGATIVE NEGATIVE   Leukocytes,Ua NEGATIVE NEGATIVE  Protein / creatinine ratio, urine     Status: None   Collection Time: 03/10/24  1:32 AM  Result Value Ref Range   Creatinine, Urine 97 mg/dL   Total Protein, Urine 8 mg/dL   Protein Creatinine Ratio 0.08 0.00 - 0.15 mg/mg[Cre]  Wet prep, genital     Status: Abnormal   Collection Time: 03/10/24  1:54 AM   Specimen: Vaginal  Result Value Ref Range   Yeast Wet Prep HPF POC NONE SEEN NONE SEEN   Trich, Wet Prep NONE SEEN NONE SEEN   Clue Cells Wet Prep HPF POC PRESENT (A) NONE SEEN   WBC, Wet Prep HPF POC >=10 (A) <10   Sperm NONE SEEN   CBC     Status: Abnormal   Collection Time: 03/10/24  2:03 AM  Result Value Ref Range   WBC 5.6 4.0 - 10.5 K/uL   RBC 3.63 (L) 3.87 - 5.11 MIL/uL   Hemoglobin 8.7 (L) 12.0 - 15.0 g/dL   HCT 71.5 (L) 63.9 - 53.9 %   MCV 78.2 (L) 80.0 - 100.0 fL   MCH 24.0 (L) 26.0 - 34.0 pg   MCHC 30.6 30.0 - 36.0 g/dL    RDW 84.8 88.4 - 84.4 %   Platelets 236 150 - 400 K/uL   nRBC 0.4 (H) 0.0 - 0.2 %  Comprehensive metabolic panel     Status: Abnormal   Collection Time: 03/10/24  2:03 AM  Result Value Ref Range   Sodium 136 135 - 145 mmol/L   Potassium 3.7 3.5 - 5.1 mmol/L   Chloride 105 98 - 111 mmol/L   CO2 22 22 - 32 mmol/L   Glucose, Bld 88 70 - 99 mg/dL   BUN 8 6 - 20 mg/dL  Creatinine, Ser 0.60 0.44 - 1.00 mg/dL   Calcium 8.9 8.9 - 89.6 mg/dL   Total Protein 6.2 (L) 6.5 - 8.1 g/dL   Albumin 2.7 (L) 3.5 - 5.0 g/dL   AST 16 15 - 41 U/L   ALT 8 0 - 44 U/L   Alkaline Phosphatase 111 38 - 126 U/L   Total Bilirubin 0.7 0.0 - 1.2 mg/dL   GFR, Estimated >39 >39 mL/min   Anion gap 9 5 - 15    Imaging:  No results found.  MDM & MAU COURSE  MDM: Moderate  MAU Course: -Initial BP mildly elevated, normal on repeat. -UA and wet prep to rule out infection.  -CBC and CMP to rule out GI pathology. Urine protein/creatinine ratio to rule out preeclampsia given initial elevated BP. -Tylenol  and Flexeril for pain while awaiting results. -Cervix closed, not in labor. -CBC shows anemia has worsened, referring for IV iron infusions due to Hgb 8.7. -Wet prep positive for BV, will treat with metronidazole . -CMP within normal limits. -Patient reports no improvement after Tylenol  and Flexeril. Patient has not been able to leave urine sample, currently drinking water. -UA negative for infection. Urine protein/creatinine ratio   Differential diagnosis considered for lower abdominal pain includes but is not limited to: round ligament pain, UTI, pyelonephritis, PID, cervicitis/vaginitis, appendicitis, diverticulitis, constipation, nephrolithiasis  Orders Placed This Encounter  Procedures   Wet prep, genital   Urinalysis, Routine w reflex microscopic -Urine, Clean Catch   CBC   Comprehensive metabolic panel   Protein / creatinine ratio, urine   Amb Referral to Intravenous Iron Therapy   Discharge patient    Meds ordered this encounter  Medications   acetaminophen  (TYLENOL ) tablet 1,000 mg   cyclobenzaprine (FLEXERIL) tablet 10 mg   metroNIDAZOLE  (FLAGYL ) 500 MG tablet    Sig: Take 1 tablet (500 mg total) by mouth 2 (two) times daily for 7 days.    Dispense:  14 tablet    Refill:  0    ASSESSMENT   1. Bacterial vaginosis in pregnancy   2. Lower abdominal pain   3. [redacted] weeks gestation of pregnancy   4. Anemia during pregnancy in third trimester     PLAN  Discharge home in stable condition with preterm labor precautions.  Metronidazole  500 mg BID x7 days for BV.     Allergies as of 03/10/2024       Reactions   Compazine  [prochlorperazine ] Other (See Comments)   Pt states it makes her feel off, and she has weird dreams while taking        Medication List     TAKE these medications    ACCRUFeR  30 MG Caps Generic drug: Ferric Maltol  Take 1 capsule (30 mg total) by mouth daily with breakfast.   Blood Pressure Kit Devi 1 kit by Does not apply route once a week.   Doxylamine -Pyridoxine  10-10 MG Tbec Commonly known as: Diclegis  Take 2 tablets by mouth at bedtime. If symptoms persist, add one tablet in the morning and one in the afternoon   IRON PO Take 1 tablet by mouth daily.   metroNIDAZOLE  500 MG tablet Commonly known as: FLAGYL  Take 1 tablet (500 mg total) by mouth 2 (two) times daily for 7 days.   multivitamin-prenatal 27-0.8 MG Tabs tablet Take 1 tablet by mouth daily at 12 noon.   ondansetron  4 MG disintegrating tablet Commonly known as: ZOFRAN -ODT Take 1-2 tablets (4-8 mg total) by mouth every 6 (six) hours as needed for nausea  or vomiting.   scopolamine  1 MG/3DAYS Commonly known as: TRANSDERM-SCOP Place 1 patch (1.5 mg total) onto the skin every 3 (three) days.        Joesph DELENA Sear, PA

## 2024-03-10 NOTE — MAU Note (Signed)
 Sherry Barton is a 23 y.o. at [redacted]w[redacted]d here in MAU reporting: lower abdominal pain that started on Friday. Patient states that the pain is constant and describes that pain as sharp. Rates pain as 9/10. Denies nausea, vomiting, diarrhea. She states that she had some vaginal discharge about 2 days ago, but denies having any odor or irritation. +FM. Denies LOF or VB.  Onset of complaint: Friday Pain score: 9/10 There were no vitals filed for this visit.   FHT: 130  Lab orders placed from triage: none

## 2024-03-10 NOTE — Telephone Encounter (Signed)
 Patient referred to infusion pharmacy team for ambulatory infusion of IV iron.  Insurance - [place] [employer] Site of care - Site of care: [facility] Dx code - O99.013 IV Iron Therapy - Monoferric 1000 mg x 1 Infusion appointments - Scheduling team will schedule patient as soon as possible.   Thank you,  Norton Blush, PharmD, [facility] Pharmacist [facility]

## 2024-03-12 ENCOUNTER — Encounter: Admitting: Obstetrics

## 2024-03-17 ENCOUNTER — Encounter (HOSPITAL_COMMUNITY): Payer: Self-pay | Admitting: Obstetrics and Gynecology

## 2024-03-17 ENCOUNTER — Inpatient Hospital Stay (HOSPITAL_COMMUNITY)
Admission: AD | Admit: 2024-03-17 | Discharge: 2024-03-17 | Disposition: A | Discharge status: Home / Self Care | Payer: Self-pay | Attending: Obstetrics and Gynecology | Admitting: Obstetrics and Gynecology

## 2024-03-17 ENCOUNTER — Other Ambulatory Visit: Payer: Self-pay

## 2024-03-17 DIAGNOSIS — Z3A37 37 weeks gestation of pregnancy: Secondary | ICD-10-CM | POA: Diagnosis not present

## 2024-03-17 DIAGNOSIS — O99013 Anemia complicating pregnancy, third trimester: Secondary | ICD-10-CM | POA: Diagnosis not present

## 2024-03-17 DIAGNOSIS — O471 False labor at or after 37 completed weeks of gestation: Secondary | ICD-10-CM

## 2024-03-17 DIAGNOSIS — Z3480 Encounter for supervision of other normal pregnancy, unspecified trimester: Secondary | ICD-10-CM

## 2024-03-17 NOTE — MAU Note (Signed)
 Sherry Barton is a 23 y.o. at [redacted]w[redacted]d here in MAU reporting: abd pain/contractions for 4 weeks- her last week and has been the same the whole 4 weeks. She reports +FMs, Denies LOF, VB, blurry vision, headaches, peripheral edema, or RUQ pain.   LMP: na Onset of complaint: 4 weeks Pain score: 9/10 Vitals:   03/17/24 1556 03/17/24 1557  BP:  126/78  Pulse:  86  Resp:  16  Temp:  98.3 F (36.8 C)  SpO2: 99%      FHT: to room  Lab orders placed from triage: mau labor eval

## 2024-03-17 NOTE — Discharge Instructions (Signed)

## 2024-03-17 NOTE — MAU Note (Cosign Needed)
 Maternal Assessment Unit Provider Note  Subjective: [name] is a 23 y.o. G1P0 pregnant female at [redacted]w[redacted]d who presents to MAU today with complaint of back and abdominal pain associated with contractions.   Objective: BP 126/78 (BP Location: Left Arm)   Pulse 86   Temp 98.3 F (36.8 C) (Oral)   Resp 16   Ht 5' 3 (1.6 m)   Wt 58.7 kg   LMP 06/21/2023 (Exact Date)   SpO2 100%   BMI 22.92 kg/m  RN SVE: 1/thick/-3    MDM: NST only  MAU Course:  Time: 4:25 PM   FHT: baseline 130 bpm, moderate variability, + accelerations, no decelerations. Reactive.  Contractions: q 6 or great mins   Assessment   ICD-10-CM   1. Supervision of other normal pregnancy, antepartum  Z34.80     2. Anemia during pregnancy in third trimester  O99.013     3. [redacted] weeks gestation of pregnancy  Z3A.37        Plan Discharge from MAU in stable condition with labor precautions  Follow up at [facility] as scheduled for ongoing prenatal care  Allergies as of 03/17/2024       Reactions   Compazine  [prochlorperazine ] Other (See Comments)   Pt states it makes her feel off, and she has weird dreams while taking        Medication List     TAKE these medications    ACCRUFeR  30 MG Caps Generic drug: Ferric Maltol  Take 1 capsule (30 mg total) by mouth daily with breakfast.   Blood Pressure Kit Devi 1 kit by Does not apply route once a week.   Doxylamine -Pyridoxine  10-10 MG Tbec Commonly known as: Diclegis  Take 2 tablets by mouth at bedtime. If symptoms persist, add one tablet in the morning and one in the afternoon   IRON PO Take 1 tablet by mouth daily.   metroNIDAZOLE  500 MG tablet Commonly known as: FLAGYL  Take 1 tablet (500 mg total) by mouth 2 (two) times daily for 7 days.   multivitamin-prenatal 27-0.8 MG Tabs tablet Take 1 tablet by mouth daily at 12 noon.   ondansetron  4 MG disintegrating tablet Commonly known as: ZOFRAN -ODT Take 1-2 tablets (4-8 mg total) by mouth  every 6 (six) hours as needed for nausea or vomiting.   scopolamine  1 MG/3DAYS Commonly known as: TRANSDERM-SCOP Place 1 patch (1.5 mg total) onto the skin every 3 (three) days.        Trudy Leeroy NOVAK, MD 03/17/2024 4:24 PM

## 2024-03-18 ENCOUNTER — Ambulatory Visit (INDEPENDENT_AMBULATORY_CARE_PROVIDER_SITE_OTHER): Admitting: Advanced Practice Midwife

## 2024-03-18 VITALS — BP 122/83 | HR 99 | Wt 130.4 lb

## 2024-03-18 DIAGNOSIS — R519 Headache, unspecified: Secondary | ICD-10-CM

## 2024-03-18 DIAGNOSIS — D649 Anemia, unspecified: Secondary | ICD-10-CM | POA: Diagnosis not present

## 2024-03-18 DIAGNOSIS — O26893 Other specified pregnancy related conditions, third trimester: Secondary | ICD-10-CM

## 2024-03-18 DIAGNOSIS — Z3480 Encounter for supervision of other normal pregnancy, unspecified trimester: Secondary | ICD-10-CM

## 2024-03-18 DIAGNOSIS — Z3A37 37 weeks gestation of pregnancy: Secondary | ICD-10-CM | POA: Diagnosis not present

## 2024-03-18 DIAGNOSIS — O99013 Anemia complicating pregnancy, third trimester: Secondary | ICD-10-CM

## 2024-03-18 DIAGNOSIS — R1030 Lower abdominal pain, unspecified: Secondary | ICD-10-CM

## 2024-03-18 MED ORDER — ACCRUFER 30 MG PO CAPS: 1.0000 | ORAL_CAPSULE | Freq: Two times a day (BID) | ORAL | 3 refills | Status: DC

## 2024-03-18 NOTE — Progress Notes (Signed)
 Abd pain since 2 weeks now w/o relief and headaches about a month. Notes some blurred vision w these. Denies arm or chest pain. Discuss plan for delivery Concerns for weekly visits as her appts were moved and she hasn't been here in a month, per pt.

## 2024-03-18 NOTE — Progress Notes (Signed)
 PRENATAL VISIT NOTE  Subjective:  Sherry Barton is a 23 y.o. G1P0 at [redacted]w[redacted]d being seen today for ongoing prenatal care.  She is currently monitored for the following issues for this low-risk pregnancy and has Supervision of other normal pregnancy, antepartum; Alpha thalassemia silent carrier; Nausea/vomiting in pregnancy; Anemia in pregnancy; and Symptomatic anemia on their problem list.  Patient reports headaches and pelvic pain.  Contractions: Irregular. Vag. Bleeding: None.  Movement: Present. Denies leaking of fluid.   The following portions of the patient's history were reviewed and updated as appropriate: allergies, current medications, past family history, past medical history, past social history, past surgical history and problem list.   Objective:   Vitals:   03/18/24 1454  BP: 122/83  Pulse: 99  Weight: 130 lb 6.4 oz (59.1 kg)    Fetal Status:  Fetal Heart Rate (bpm): 130   Movement: Present    General: Alert, oriented and cooperative. Patient is in no acute distress.  Skin: Skin is warm and dry. No rash noted.   Cardiovascular: Normal heart rate noted  Respiratory: Normal respiratory effort, no problems with respiration noted  Abdomen: Soft, gravid, appropriate for gestational age.  Pain/Pressure: Present     Pelvic: Cervical exam deferred        Extremities: Normal range of motion.  Edema: Trace  Mental Status: Normal mood and affect. Normal behavior. Normal judgment and thought content.      01/15/2024   10:19 AM 08/22/2023    9:34 AM  Depression screen PHQ 2/9  Decreased Interest 2 0  Down, Depressed, Hopeless 0 0  PHQ - 2 Score 2 0  Altered sleeping 1 0  Tired, decreased energy 1 1  Change in appetite 1 0  Feeling bad or failure about yourself  0 0  Trouble concentrating 0 0  Moving slowly or fidgety/restless 0 0  Suicidal thoughts 0 0  PHQ-9 Score 5  1   Difficult doing work/chores Not difficult at all Not difficult at all     Data saved with a  previous flowsheet row definition        01/15/2024   10:19 AM 08/22/2023    9:34 AM  GAD 7 : Generalized Anxiety Score  Nervous, Anxious, on Edge 0 0  Control/stop worrying 0 0  Worry too much - different things 0 0  Trouble relaxing 0 0  Restless 0 0  Easily annoyed or irritable 0 0  Afraid - awful might happen 0 0  Total GAD 7 Score 0 0  Anxiety Difficulty Not difficult at all Not difficult at all    Assessment and Plan:  Pregnancy: G1P0 at [redacted]w[redacted]d 1. Supervision of other normal pregnancy, antepartum (Primary) --Anticipatory guidance about next visits/weeks of pregnancy given.  --Pt no longer wants a waterbirth, did not take class and is not interested anymore.   2. Symptomatic anemia --See h/a  3. Anemia during pregnancy in third trimester --Iron infusion for Monoferric?  Ordered at [facility] infusion center. Pt tried to call and left message but noone called back.   --Orders discontinued and IV Venofer 200 mg x 5 doses ordered at [facility] infusion center --Pt to increase Accrufer  to BID until IV iron infusions, and to resume after infusions.   4. [redacted] weeks gestation of pregnancy   5. Lower abdominal pain --Constant pelvic pressure, no regular contractions --Reviewed s/sx of labor, how to time contractions, etc  6. Headache in pregnancy, antepartum, third trimester --Likely due to anemia, see  above  Term labor symptoms and general obstetric precautions including but not limited to vaginal bleeding, contractions, leaking of fluid and fetal movement were reviewed in detail with the patient. Please refer to After Visit Summary for other counseling recommendations.   Return in about 1 week (around 03/25/2024) for LOB.  Future Appointments  Date Time Provider Department Center  03/26/2024  1:50 PM Delores Nidia CROME, FNP [facility] None    Olam Boards, CNM

## 2024-03-19 ENCOUNTER — Encounter: Payer: Self-pay | Admitting: Advanced Practice Midwife

## 2024-03-19 ENCOUNTER — Telehealth: Payer: Self-pay

## 2024-03-19 NOTE — Telephone Encounter (Signed)
 Olam, patient will be scheduled as soon as possible.  Auth Submission: NO AUTH NEEDED Site of care: Site of care: [facility] Payer: [employer] [place] medicaid Medication & CPT/J Code(s) submitted: Venofer (Iron Sucrose) J1756 Diagnosis Code:  Route of submission (phone, fax, portal):  Phone # Fax # Auth type: Buy/Bill PB Units/visits requested: 200mg  x 5 doses Reference number:  Approval from: 03/19/24 to 05/07/24

## 2024-03-20 ENCOUNTER — Encounter: Payer: Self-pay | Admitting: Advanced Practice Midwife

## 2024-03-20 ENCOUNTER — Ambulatory Visit

## 2024-03-20 VITALS — BP 138/88 | HR 74 | Temp 98.1°F | Resp 18 | Ht 63.0 in | Wt 130.2 lb

## 2024-03-20 DIAGNOSIS — Z3A37 37 weeks gestation of pregnancy: Secondary | ICD-10-CM

## 2024-03-20 DIAGNOSIS — O99013 Anemia complicating pregnancy, third trimester: Secondary | ICD-10-CM | POA: Diagnosis not present

## 2024-03-20 DIAGNOSIS — D649 Anemia, unspecified: Secondary | ICD-10-CM

## 2024-03-20 MED ORDER — IRON SUCROSE 200 MG IVPB - SIMPLE MED
200.0000 mg | Freq: Once | Status: AC
  Administered 2024-03-20: 200 mg via INTRAVENOUS
  Filled 2024-03-20: qty 110

## 2024-03-20 NOTE — Progress Notes (Signed)
 Diagnosis: Iron Deficiency Anemia  Provider:  Praveen Mannam MD  Procedure: IV Infusion  IV Type: Peripheral, IV Location: R Forearm  Venofer (Iron Sucrose), Dose: 200 mg  Infusion Start Time: 1202  Infusion Stop Time: 1218  Post Infusion IV Care: Observation period completed and Peripheral IV Discontinued  Discharge: Condition: Good, Destination: Home . AVS Provided  Performed by:  Bethene Hankinson, RN

## 2024-03-20 NOTE — Patient Instructions (Signed)

## 2024-03-21 ENCOUNTER — Other Ambulatory Visit: Payer: Self-pay

## 2024-03-21 ENCOUNTER — Encounter (HOSPITAL_COMMUNITY): Payer: Self-pay | Admitting: Obstetrics and Gynecology

## 2024-03-21 ENCOUNTER — Inpatient Hospital Stay (HOSPITAL_COMMUNITY)
Admission: AD | Admit: 2024-03-21 | Discharge: 2024-03-25 | DRG: 806 | Disposition: A | Discharge status: Home / Self Care | Payer: Self-pay | Attending: Obstetrics and Gynecology | Admitting: Obstetrics and Gynecology

## 2024-03-21 DIAGNOSIS — Z148 Genetic carrier of other disease: Secondary | ICD-10-CM

## 2024-03-21 DIAGNOSIS — O1414 Severe pre-eclampsia complicating childbirth: Principal | ICD-10-CM | POA: Diagnosis present

## 2024-03-21 DIAGNOSIS — O1410 Severe pre-eclampsia, unspecified trimester: Secondary | ICD-10-CM | POA: Diagnosis present

## 2024-03-21 DIAGNOSIS — O99824 Streptococcus B carrier state complicating childbirth: Secondary | ICD-10-CM | POA: Diagnosis present

## 2024-03-21 DIAGNOSIS — O326XX0 Maternal care for compound presentation, not applicable or unspecified: Secondary | ICD-10-CM | POA: Diagnosis present

## 2024-03-21 DIAGNOSIS — O26833 Pregnancy related renal disease, third trimester: Secondary | ICD-10-CM | POA: Diagnosis present

## 2024-03-21 DIAGNOSIS — Z3A38 38 weeks gestation of pregnancy: Secondary | ICD-10-CM

## 2024-03-21 DIAGNOSIS — Z3480 Encounter for supervision of other normal pregnancy, unspecified trimester: Secondary | ICD-10-CM

## 2024-03-21 DIAGNOSIS — O1413 Severe pre-eclampsia, third trimester: Principal | ICD-10-CM | POA: Diagnosis present

## 2024-03-21 DIAGNOSIS — Z3689 Encounter for other specified antenatal screening: Secondary | ICD-10-CM

## 2024-03-21 DIAGNOSIS — K219 Gastro-esophageal reflux disease without esophagitis: Secondary | ICD-10-CM | POA: Diagnosis present

## 2024-03-21 DIAGNOSIS — O9962 Diseases of the digestive system complicating childbirth: Secondary | ICD-10-CM | POA: Diagnosis present

## 2024-03-21 DIAGNOSIS — O99013 Anemia complicating pregnancy, third trimester: Secondary | ICD-10-CM

## 2024-03-21 DIAGNOSIS — O9902 Anemia complicating childbirth: Secondary | ICD-10-CM | POA: Diagnosis present

## 2024-03-21 DIAGNOSIS — N179 Acute kidney failure, unspecified: Secondary | ICD-10-CM | POA: Diagnosis not present

## 2024-03-21 DIAGNOSIS — Z8759 Personal history of other complications of pregnancy, childbirth and the puerperium: Secondary | ICD-10-CM | POA: Diagnosis present

## 2024-03-21 NOTE — MAU Note (Signed)
 Pt says UC's strong since 947pm. [facility]- [facility] Denies HSV GBS-  VE 2 weeks ago- 1cm Feels baby moving

## 2024-03-22 ENCOUNTER — Encounter (HOSPITAL_COMMUNITY): Payer: Self-pay | Admitting: Obstetrics and Gynecology

## 2024-03-22 ENCOUNTER — Inpatient Hospital Stay (HOSPITAL_COMMUNITY): Admitting: Anesthesiology

## 2024-03-22 ENCOUNTER — Other Ambulatory Visit: Payer: Self-pay

## 2024-03-22 DIAGNOSIS — O9962 Diseases of the digestive system complicating childbirth: Secondary | ICD-10-CM | POA: Diagnosis present

## 2024-03-22 DIAGNOSIS — Z3A38 38 weeks gestation of pregnancy: Secondary | ICD-10-CM

## 2024-03-22 DIAGNOSIS — O1414 Severe pre-eclampsia complicating childbirth: Secondary | ICD-10-CM | POA: Diagnosis present

## 2024-03-22 DIAGNOSIS — Z8759 Personal history of other complications of pregnancy, childbirth and the puerperium: Secondary | ICD-10-CM | POA: Diagnosis present

## 2024-03-22 DIAGNOSIS — Z148 Genetic carrier of other disease: Secondary | ICD-10-CM | POA: Diagnosis not present

## 2024-03-22 DIAGNOSIS — O9902 Anemia complicating childbirth: Secondary | ICD-10-CM | POA: Diagnosis present

## 2024-03-22 DIAGNOSIS — K219 Gastro-esophageal reflux disease without esophagitis: Secondary | ICD-10-CM | POA: Diagnosis present

## 2024-03-22 DIAGNOSIS — O26893 Other specified pregnancy related conditions, third trimester: Secondary | ICD-10-CM | POA: Diagnosis present

## 2024-03-22 DIAGNOSIS — O326XX0 Maternal care for compound presentation, not applicable or unspecified: Secondary | ICD-10-CM | POA: Diagnosis present

## 2024-03-22 DIAGNOSIS — O99824 Streptococcus B carrier state complicating childbirth: Secondary | ICD-10-CM | POA: Diagnosis present

## 2024-03-22 DIAGNOSIS — N179 Acute kidney failure, unspecified: Secondary | ICD-10-CM | POA: Diagnosis present

## 2024-03-22 DIAGNOSIS — O1410 Severe pre-eclampsia, unspecified trimester: Secondary | ICD-10-CM | POA: Diagnosis present

## 2024-03-22 DIAGNOSIS — O471 False labor at or after 37 completed weeks of gestation: Secondary | ICD-10-CM | POA: Diagnosis not present

## 2024-03-22 DIAGNOSIS — O26833 Pregnancy related renal disease, third trimester: Secondary | ICD-10-CM | POA: Diagnosis present

## 2024-03-22 DIAGNOSIS — O1413 Severe pre-eclampsia, third trimester: Secondary | ICD-10-CM | POA: Diagnosis present

## 2024-03-22 LAB — CBC
HCT: 32.5 % — ABNORMAL LOW (ref 36.0–46.0)
HCT: 33 % — ABNORMAL LOW (ref 36.0–46.0)
Hemoglobin: 10 g/dL — ABNORMAL LOW (ref 12.0–15.0)
Hemoglobin: 10.3 g/dL — ABNORMAL LOW (ref 12.0–15.0)
MCH: 24 pg — ABNORMAL LOW (ref 26.0–34.0)
MCH: 24 pg — ABNORMAL LOW (ref 26.0–34.0)
MCHC: 30.8 g/dL (ref 30.0–36.0)
MCHC: 31.2 g/dL (ref 30.0–36.0)
MCV: 76.9 fL — ABNORMAL LOW (ref 80.0–100.0)
MCV: 77.9 fL — ABNORMAL LOW (ref 80.0–100.0)
Platelets: 209 K/uL (ref 150–400)
Platelets: 246 K/uL (ref 150–400)
RBC: 4.17 MIL/uL (ref 3.87–5.11)
RBC: 4.29 MIL/uL (ref 3.87–5.11)
RDW: 16 % — ABNORMAL HIGH (ref 11.5–15.5)
RDW: 16.1 % — ABNORMAL HIGH (ref 11.5–15.5)
WBC: 12.3 K/uL — ABNORMAL HIGH (ref 4.0–10.5)
WBC: 7.7 K/uL (ref 4.0–10.5)
nRBC: 0.2 % (ref 0.0–0.2)
nRBC: 0.7 % — ABNORMAL HIGH (ref 0.0–0.2)

## 2024-03-22 LAB — URINALYSIS, ROUTINE W REFLEX MICROSCOPIC
Glucose, UA: NEGATIVE mg/dL
Hgb urine dipstick: NEGATIVE
Ketones, ur: >80 mg/dL — AB
Nitrite: NEGATIVE
Protein, ur: 30 mg/dL — AB
Specific Gravity, Urine: 1.02 (ref 1.005–1.030)
pH: 7 (ref 5.0–8.0)

## 2024-03-22 LAB — RPR: RPR Ser Ql: NONREACTIVE

## 2024-03-22 LAB — PROTEIN / CREATININE RATIO, URINE
Creatinine, Urine: 158 mg/dL
Protein Creatinine Ratio: 0.2 mg/mg{creat} — ABNORMAL HIGH (ref 0.00–0.15)
Total Protein, Urine: 31 mg/dL

## 2024-03-22 LAB — COMPREHENSIVE METABOLIC PANEL WITH GFR
ALT: 10 U/L (ref 0–44)
AST: 19 U/L (ref 15–41)
Albumin: 2.9 g/dL — ABNORMAL LOW (ref 3.5–5.0)
Alkaline Phosphatase: 150 U/L — ABNORMAL HIGH (ref 38–126)
Anion gap: 14 (ref 5–15)
BUN: 7 mg/dL (ref 6–20)
CO2: 20 mmol/L — ABNORMAL LOW (ref 22–32)
Calcium: 9.7 mg/dL (ref 8.9–10.3)
Chloride: 100 mmol/L (ref 98–111)
Creatinine, Ser: 0.62 mg/dL (ref 0.44–1.00)
GFR, Estimated: >60 mL/min (ref >60)
Glucose, Bld: 78 mg/dL (ref 70–99)
Potassium: 4.2 mmol/L (ref 3.5–5.1)
Sodium: 134 mmol/L — ABNORMAL LOW (ref 135–145)
Total Bilirubin: 0.8 mg/dL (ref 0.0–1.2)
Total Protein: 6.7 g/dL (ref 6.5–8.1)

## 2024-03-22 LAB — TYPE AND SCREEN
ABO/RH(D): O POS
Antibody Screen: NEGATIVE

## 2024-03-22 LAB — URINALYSIS, MICROSCOPIC (REFLEX): Bacteria, UA: NONE SEEN

## 2024-03-22 LAB — GROUP B STREP BY PCR: Group B strep by PCR: POSITIVE — AB

## 2024-03-22 MED ORDER — NIFEDIPINE 10 MG PO CAPS
20.0000 mg | ORAL_CAPSULE | ORAL | Status: DC | PRN
  Administered 2024-03-22: 20 mg via ORAL
  Filled 2024-03-22: qty 2

## 2024-03-22 MED ORDER — OXYCODONE-ACETAMINOPHEN 5-325 MG PO TABS: 2.0000 | ORAL_TABLET | ORAL | Status: DC | PRN

## 2024-03-22 MED ORDER — HYDRALAZINE HCL 20 MG/ML IJ SOLN: 10.0000 mg | INTRAMUSCULAR | Status: DC | PRN

## 2024-03-22 MED ORDER — LACTATED RINGERS IV SOLN: INTRAVENOUS | Status: DC

## 2024-03-22 MED ORDER — LACTATED RINGERS IV SOLN: 500.0000 mL | INTRAVENOUS | Status: DC | PRN

## 2024-03-22 MED ORDER — ACETAMINOPHEN-CAFFEINE 500-65 MG PO TABS
2.0000 | ORAL_TABLET | Freq: Once | ORAL | Status: AC
  Administered 2024-03-22: 2 via ORAL
  Filled 2024-03-22: qty 2

## 2024-03-22 MED ORDER — MAGNESIUM SULFATE BOLUS VIA INFUSION
4.0000 g | Freq: Once | INTRAVENOUS | Status: AC
  Administered 2024-03-22: 4 g via INTRAVENOUS
  Filled 2024-03-22: qty 1000

## 2024-03-22 MED ORDER — EPHEDRINE 5 MG/ML INJ: 10.0000 mg | INTRAVENOUS | Status: DC | PRN

## 2024-03-22 MED ORDER — LABETALOL HCL 5 MG/ML IV SOLN
20.0000 mg | INTRAVENOUS | Status: DC | PRN
  Filled 2024-03-22: qty 4

## 2024-03-22 MED ORDER — OXYCODONE-ACETAMINOPHEN 5-325 MG PO TABS: 1.0000 | ORAL_TABLET | ORAL | Status: DC | PRN

## 2024-03-22 MED ORDER — OXYTOCIN-SODIUM CHLORIDE 30-0.9 UT/500ML-% IV SOLN: 2.5000 [IU]/h | INTRAVENOUS | Status: DC

## 2024-03-22 MED ORDER — ONDANSETRON HCL 4 MG/2ML IJ SOLN
4.0000 mg | Freq: Four times a day (QID) | INTRAMUSCULAR | Status: DC | PRN
  Administered 2024-03-22 (×2): 4 mg via INTRAVENOUS
  Filled 2024-03-22 (×2): qty 2

## 2024-03-22 MED ORDER — OXYTOCIN BOLUS FROM INFUSION: 333.0000 mL | Freq: Once | INTRAVENOUS | Status: DC

## 2024-03-22 MED ORDER — LABETALOL HCL 5 MG/ML IV SOLN: 40.0000 mg | INTRAVENOUS | Status: DC | PRN

## 2024-03-22 MED ORDER — DIPHENHYDRAMINE HCL 50 MG/ML IJ SOLN: 12.5000 mg | INTRAMUSCULAR | Status: DC | PRN

## 2024-03-22 MED ORDER — FENTANYL-BUPIVACAINE-NACL 0.5-0.125-0.9 MG/250ML-% EP SOLN
12.0000 mL/h | EPIDURAL | Status: DC | PRN
  Administered 2024-03-22: 12 mL/h via EPIDURAL
  Filled 2024-03-22: qty 250

## 2024-03-22 MED ORDER — LIDOCAINE HCL (PF) 1 % IJ SOLN: 30.0000 mL | INTRAMUSCULAR | Status: DC | PRN

## 2024-03-22 MED ORDER — SODIUM CHLORIDE 0.9 % IV SOLN
12.5000 mg | Freq: Once | INTRAVENOUS | Status: AC
  Administered 2024-03-22: 12.5 mg via INTRAVENOUS
  Filled 2024-03-22: qty 0.5

## 2024-03-22 MED ORDER — FENTANYL CITRATE (PF) 100 MCG/2ML IJ SOLN
50.0000 ug | INTRAMUSCULAR | Status: DC | PRN
  Administered 2024-03-22: 100 ug via INTRAVENOUS
  Administered 2024-03-22: 50 ug via INTRAVENOUS
  Filled 2024-03-22 (×2): qty 2

## 2024-03-22 MED ORDER — OXYTOCIN BOLUS FROM INFUSION
333.0000 mL | Freq: Once | INTRAVENOUS | Status: AC
  Administered 2024-03-23: 333 mL via INTRAVENOUS

## 2024-03-22 MED ORDER — SOD CITRATE-CITRIC ACID 500-334 MG/5ML PO SOLN
30.0000 mL | ORAL | Status: DC | PRN
  Administered 2024-03-22: 30 mL via ORAL

## 2024-03-22 MED ORDER — NIFEDIPINE 10 MG PO CAPS: 20.0000 mg | ORAL_CAPSULE | ORAL | Status: DC | PRN

## 2024-03-22 MED ORDER — MAGNESIUM SULFATE 40 GM/1000ML IV SOLN
2.0000 g/h | INTRAVENOUS | Status: DC
  Administered 2024-03-22 (×2): 2 g/h via INTRAVENOUS
  Filled 2024-03-22 (×2): qty 1000

## 2024-03-22 MED ORDER — SODIUM CHLORIDE 0.9 % IV SOLN
5.0000 10*6.[IU] | Freq: Once | INTRAVENOUS | Status: AC
  Administered 2024-03-22: 5 10*6.[IU] via INTRAVENOUS
  Filled 2024-03-22: qty 5

## 2024-03-22 MED ORDER — ONDANSETRON HCL 4 MG/2ML IJ SOLN: 4.0000 mg | Freq: Four times a day (QID) | INTRAMUSCULAR | Status: DC | PRN

## 2024-03-22 MED ORDER — LACTATED RINGERS IV SOLN
500.0000 mL | Freq: Once | INTRAVENOUS | Status: AC
  Administered 2024-03-22: 500 mL via INTRAVENOUS

## 2024-03-22 MED ORDER — OXYTOCIN-SODIUM CHLORIDE 30-0.9 UT/500ML-% IV SOLN
1.0000 m[IU]/min | INTRAVENOUS | Status: DC
  Administered 2024-03-22: 2 m[IU]/min via INTRAVENOUS
  Filled 2024-03-22: qty 500

## 2024-03-22 MED ORDER — MISOPROSTOL 25 MCG QUARTER TABLET
25.0000 ug | ORAL_TABLET | ORAL | Status: DC
  Administered 2024-03-22: 25 ug via VAGINAL
  Filled 2024-03-22 (×4): qty 1

## 2024-03-22 MED ORDER — ACETAMINOPHEN 325 MG PO TABS: 650.0000 mg | ORAL_TABLET | ORAL | Status: DC | PRN

## 2024-03-22 MED ORDER — PENICILLIN G POT IN DEXTROSE 60000 UNIT/ML IV SOLN
3.0000 10*6.[IU] | INTRAVENOUS | Status: DC
  Administered 2024-03-22 (×4): 3 10*6.[IU] via INTRAVENOUS
  Filled 2024-03-22 (×7): qty 50

## 2024-03-22 MED ORDER — PHENYLEPHRINE 80 MCG/ML (10ML) SYRINGE FOR IV PUSH (FOR BLOOD PRESSURE SUPPORT)
80.0000 ug | PREFILLED_SYRINGE | INTRAVENOUS | Status: DC | PRN
  Filled 2024-03-22: qty 10

## 2024-03-22 MED ORDER — PHENYLEPHRINE 80 MCG/ML (10ML) SYRINGE FOR IV PUSH (FOR BLOOD PRESSURE SUPPORT): 80.0000 ug | PREFILLED_SYRINGE | INTRAVENOUS | Status: DC | PRN

## 2024-03-22 MED ORDER — NIFEDIPINE 10 MG PO CAPS
10.0000 mg | ORAL_CAPSULE | ORAL | Status: DC | PRN
  Administered 2024-03-22: 10 mg via ORAL
  Filled 2024-03-22: qty 1

## 2024-03-22 MED ORDER — SOD CITRATE-CITRIC ACID 500-334 MG/5ML PO SOLN
30.0000 mL | ORAL | Status: DC | PRN
Start: 2024-03-22 — End: 2024-03-23
  Filled 2024-03-22: qty 30

## 2024-03-22 MED ORDER — TERBUTALINE SULFATE 1 MG/ML IJ SOLN: 0.2500 mg | Freq: Once | INTRAMUSCULAR | Status: DC | PRN

## 2024-03-22 MED ORDER — LIDOCAINE HCL (PF) 1 % IJ SOLN
INTRAMUSCULAR | Status: DC | PRN
  Administered 2024-03-22: 5 mL via EPIDURAL
  Administered 2024-03-22: 4 mL via EPIDURAL

## 2024-03-22 MED ORDER — LABETALOL HCL 5 MG/ML IV SOLN: 80.0000 mg | INTRAVENOUS | Status: DC | PRN

## 2024-03-22 NOTE — Progress Notes (Signed)
 LABOR PROGRESS NOTE Pt rechecked at 1415 Patient comfortable with epidural. Pit at 6. SCE: 4/70/-2 AROM cf FHT: baseline 110, min variability, -accels, -decels; overall category II. Toco: q3 min  A/P:  On mag Continue pitocin titration per protocol   Barabara Maier, DO 2:17 PM

## 2024-03-22 NOTE — Anesthesia Preprocedure Evaluation (Addendum)
 Anesthesia Evaluation  Patient identified by MRN, date of birth, ID band Patient awake    Reviewed: Allergy & Precautions, Patient's Chart, lab work & pertinent test results  Airway Mallampati: II  TM Distance: >3 FB     Dental no notable dental hx. (+) Teeth Intact   Pulmonary neg pulmonary ROS   Pulmonary exam normal breath sounds clear to auscultation       Cardiovascular hypertension, Pt. on medications Normal cardiovascular exam Rhythm:Regular Rate:Normal     Neuro/Psych negative neurological ROS  negative psych ROS   GI/Hepatic Neg liver ROS,GERD  ,,  Endo/Other  negative endocrine ROS    Renal/GU   negative genitourinary   Musculoskeletal negative musculoskeletal ROS (+)    Abdominal   Peds  Hematology  (+) Blood dyscrasia, anemia   Anesthesia Other Findings   Reproductive/Obstetrics (+) Pregnancy 38 weeks  Severe Pre Eclampsia                              Anesthesia Physical Anesthesia Plan  ASA: 3  Anesthesia Plan: Epidural   Post-op Pain Management: Minimal or no pain anticipated   Induction: Intravenous  PONV Risk Score and Plan: Treatment may vary due to age or medical condition  Airway Management Planned: Natural Airway  Additional Equipment: None and Fetal Monitoring  Intra-op Plan:   Post-operative Plan:   Informed Consent: I have reviewed the patients History and Physical, chart, labs and discussed the procedure including the risks, benefits and alternatives for the proposed anesthesia with the patient or authorized representative who has indicated his/her understanding and acceptance.       Plan Discussed with: CRNA and Anesthesiologist  Anesthesia Plan Comments:         Anesthesia Quick Evaluation

## 2024-03-22 NOTE — Progress Notes (Signed)
 Patient ID: Sherry Barton, female   DOB: 2001-02-14, 23 y.o.   MRN: [id number]   Subjective: -Care assumed of 24 y.o. G1P0 at [redacted]w[redacted]d who presents for IOL s/t Severe PreE. In room to meet acquaintance of patient and family.  Patient reports nausea despite zofran  dosing. Reports allergy to compazine  with feeling funny and bad dreams noted.   Objective: BP (!) 146/94   Pulse (!) 127   Temp 98.2 F (36.8 C) (Oral)   Resp 18   Ht 5' 3 (1.6 m)   Wt 57.6 kg   LMP 06/21/2023 (Exact Date)   SpO2 99%   BMI 22.50 kg/m  I/O last 3 completed shifts: In: 3157.4 [P.O.:640; I.V.:2067.4; IV Piggyback:450] Out: 1850 [Urine:1850] No intake/output data recorded.  Fetal Monitoring: FHT: 105 bpm, Mod Var, -Decels, -Accels UC: Q3-52min    Physical Exam: General appearance: alert, well appearing, and in no distress. Chest: normal rate and regular rhythm.  clear to auscultation, no wheezes, rales or rhonchi, symmetric air entry. Abdominal exam: Gravid, Appears AGA. Extremities: Mild Edema Skin exam: Warm Dry  Vaginal Exam: SVE:   Dilation: 6 Effacement (%): 90 Station: -1 Exam by:: Dr. Danny Membranes:AROM x 7 hrs Internal Monitors: None  Augmentation/Induction: Pitocin:64mUn/min Cytotec: S/P One Dose  Assessment:  IUP at 38 weeks Cat I FT  Severe PreE Nausea GBS Positive  Plan: -Discussed prescribing phenergan  and that some similar symptoms experienced with compazine  may present.  -Reviewed continued titration of pitocin. -Continue Magnesium until PP x 24 hrs.  -PCN prophylaxis in progress.   Sherry Barton Sherry Brittingham,MSN, CNM 03/22/2024, 8:54 PM

## 2024-03-22 NOTE — H&P (Addendum)
 OBSTETRIC ADMISSION HISTORY AND PHYSICAL  Sherry Barton is a 23 y.o. female G1P0 with IUP at [redacted]w[redacted]d presenting to the MAU for contractions. She was not in active labor, but found to have severe range BPs and was admitted for IOL for PreE with SF. She reports +FMs, No LOF, no VB, no peripheral edema, and no RUQ pain. She endorses some blurry vision and headache that has started in the last hour. She plans on breast and bottle feeding. She requests the patch for birth control. She [facility] her [facility]   Dating: By US  at 6 weeks --->  Estimated Date of Delivery: 04/05/24  Sono:    @[redacted]w[redacted]d , CWD, normal anatomy, cephalic presentation, posterior lie, 501g, 42% EFW   Prenatal History/Complications:  Anemia PreE with SF (dx 03/22/2024)   Past Medical History: Past Medical History:  Diagnosis Date   Chlamydia     Past Surgical History: Past Surgical History:  Procedure Laterality Date   NO PAST SURGERIES      Obstetrical History: OB History     Gravida  1   Para      Term      Preterm      AB      Living         SAB      IAB      Ectopic      Multiple      Live Births              Social History Social History   Socioeconomic History   Marital status: Single    Spouse name: Not on file   Number of children: Not on file   Years of education: Not on file   Highest education level: Not on file  Occupational History   Not on file  Tobacco Use   Smoking status: Never    Passive exposure: Never   Smokeless tobacco: Never  Vaping Use   Vaping status: Former   Substances: Nicotine  Substance and Sexual Activity   Alcohol use: Never   Drug use: Not Currently    Types: Marijuana    Comment: been a long time, sometime 2024   Sexual activity: Yes    Comment: currently pregnant  Other Topics Concern   Not on file  Social History Narrative   Not on file   Social Drivers of Health   Financial Resource Strain: Not on file  Food  Insecurity: No Food Insecurity (03/21/2024)   Hunger Vital Sign    Worried About Running Out of Food in the Last Year: Never true    Ran Out of Food in the Last Year: Never true  Transportation Needs: No Transportation Needs (03/21/2024)   PRAPARE - Administrator, Civil Service (Medical): No    Lack of Transportation (Non-Medical): No  Physical Activity: Not on file  Stress: Not on file  Social Connections: Not on file    Family History: Family History  Problem Relation Age of Onset   Healthy Mother    Healthy Father     Allergies: Allergies  Allergen Reactions   Compazine  [Prochlorperazine ] Other (See Comments)    Pt states it makes her feel off, and she has weird dreams while taking    Medications Prior to Admission  Medication Sig Dispense Refill Last Dose/Taking   Ferrous Sulfate (IRON PO) Take 1 tablet by mouth daily.   03/21/2024   ondansetron  (ZOFRAN -ODT) 4 MG disintegrating tablet Take 1-2 tablets (4-8  mg total) by mouth every 6 (six) hours as needed for nausea or vomiting. 30 tablet 1 Past Week   Prenatal Vit-Fe Fumarate-FA (MULTIVITAMIN-PRENATAL) 27-0.8 MG TABS tablet Take 1 tablet by mouth daily at 12 noon.   03/21/2024   Blood Pressure Monitoring (BLOOD PRESSURE KIT) DEVI 1 kit by Does not apply route once a week. 1 each 0    Doxylamine -Pyridoxine  (DICLEGIS ) 10-10 MG TBEC Take 2 tablets by mouth at bedtime. If symptoms persist, add one tablet in the morning and one in the afternoon (Patient not taking: Reported on 03/18/2024) 100 tablet 5    Ferric Maltol  (ACCRUFER ) 30 MG CAPS Take 1 capsule (30 mg total) by mouth 2 (two) times daily. 60 capsule 3    scopolamine  (TRANSDERM-SCOP) 1 MG/3DAYS Place 1 patch (1.5 mg total) onto the skin every 3 (three) days. (Patient not taking: Reported on 03/18/2024) 10 patch 12      Review of Systems   All systems reviewed and negative except as stated in HPI  Blood pressure (!) 169/99, pulse 64, temperature 98.3 F  (36.8 C), temperature source Oral, resp. rate 12, height 5' 3 (1.6 m), weight 57.6 kg, last menstrual period 06/21/2023, SpO2 99%. General appearance: alert, cooperative, appears stated age, and no distress Lungs: clear to auscultation bilaterally Heart: regular rate and rhythm Abdomen: soft, non-tender; bowel sounds normal Extremities: Homans sign is negative, no sign of DVT  Fetal monitoringBaseline: 130 bpm, Variability: Good {> 6 bpm), and Accelerations: Reactive Uterine activityDate/time of onset: 0200, Frequency: Every 4 minutes Dilation: 1 Effacement (%): Thick Exam by:: FREDRIK Dalton, NP   Prenatal labs: ABO, Rh: --/--/O POS (04/06 1202) Antibody: Negative (05/12 1657) Rubella: 4.61 (05/12 1431) RPR: Non Reactive (09/09 0908)  HBsAg: Negative (04/03 1609)  HIV: Non Reactive (09/09 0908)  GBS:     No results found for: GBS GTT Early HgbA1C: 5.5  Third trimester 2 hr GTT: nl 2 hour Genetic screening  NIPS: LR XX  AFP: neg Anatomy US  wnl  Immunization History  Administered Date(s) Administered   DTaP 03/15/2006   DTaP / IPV 09/12/2007   Dtap, Unspecified 06/18/2003, 11/18/2003, 03/03/2005   HIB, Unspecified 06/18/2003, 11/18/2003, 11/29/2004   HPV 9-valent 10/14/2015   Hep A, Unspecified 04/06/2005   Hep B, Unspecified 04/08/2003, 06/18/2003, 11/29/2004   Hepatitis A, Ped/Adol-2 Dose 03/15/2006   Influenza Nasal 06/23/2010   Influenza, Seasonal, Injecte, Preservative Fre 03/15/2006, 01/15/2024   Influenza,inj,Quad PF,6+ Mos 07/29/2014   Influenza-Unspecified 03/03/2005, 04/06/2005   MMR 11/29/2004, 09/12/2007   Meningococcal B, OMV 06/21/2020, 08/19/2020   Meningococcal Conjugate 06/25/2014, 06/21/2020   PPD Test 07/12/2021   Pneumococcal Conjugate PCV 7 06/18/2003, 11/18/2003, 11/29/2004   Polio, Unspecified 06/18/2003, 11/18/2003, 03/03/2005   Tdap 06/25/2014, 01/15/2024   Varicella 11/29/2004, 09/12/2007    Prenatal Transfer Tool  Maternal Diabetes:  No Genetic Screening: Normal Maternal Ultrasounds/Referrals: Normal Fetal Ultrasounds or other Referrals:  None Maternal Substance Abuse:  No Significant Maternal Medications:  None Significant Maternal Lab Results: Rh negative GBS unknown (pending) Number of Prenatal Visits:greater than 3 verified prenatal visits Maternal Vaccinations:TDap and Flu Other Comments:  None   Results for orders placed or performed during the hospital encounter of 03/21/24 (from the past 24 hours)  Urinalysis, Routine w reflex microscopic -Urine, Random   Collection Time: 03/22/24 12:35 AM  Result Value Ref Range   Color, Urine YELLOW YELLOW   APPearance CLEAR CLEAR   Specific Gravity, Urine 1.020 1.005 - 1.030   pH 7.0 5.0 -  8.0   Glucose, UA NEGATIVE NEGATIVE mg/dL   Hgb urine dipstick NEGATIVE NEGATIVE   Bilirubin Urine SMALL (A) NEGATIVE   Ketones, ur >80 (A) NEGATIVE mg/dL   Protein, ur 30 (A) NEGATIVE mg/dL   Nitrite NEGATIVE NEGATIVE   Leukocytes,Ua TRACE (A) NEGATIVE  Urinalysis, Microscopic (reflex)   Collection Time: 03/22/24 12:35 AM  Result Value Ref Range   RBC / HPF 0-5 0 - 5 RBC/hpf   WBC, UA 0-5 0 - 5 WBC/hpf   Bacteria, UA NONE SEEN NONE SEEN   Squamous Epithelial / HPF 0-5 0 - 5 /HPF  CBC   Collection Time: 03/22/24 12:43 AM  Result Value Ref Range   WBC 7.7 4.0 - 10.5 K/uL   RBC 4.17 3.87 - 5.11 MIL/uL   Hemoglobin 10.0 (L) 12.0 - 15.0 g/dL   HCT 67.4 (L) 63.9 - 53.9 %   MCV 77.9 (L) 80.0 - 100.0 fL   MCH 24.0 (L) 26.0 - 34.0 pg   MCHC 30.8 30.0 - 36.0 g/dL   RDW 83.9 (H) 88.4 - 84.4 %   Platelets 209 150 - 400 K/uL   nRBC 0.7 (H) 0.0 - 0.2 %    Patient Active Problem List   Diagnosis Date Noted   Symptomatic anemia 03/18/2024   Anemia in pregnancy 01/21/2024   Nausea/vomiting in pregnancy 12/21/2023   Alpha thalassemia silent carrier 09/27/2023   Supervision of other normal pregnancy, antepartum 08/22/2023    Assessment/Plan:  Sherry Barton is a 23 y.o.  G1P0 at [redacted]w[redacted]d here for IOL for PreE with SF.  #Labor: Patient is currently feeling some contractions and pressure. She is dilated 1 cm. Consider cytotec or foley balloon #Pain: Patient would prefer an epidural  #FWB: Cat I #GBS status:  Positive> PCN #Feeding: Breastmilk  and Formula #Reproductive Life planning: Patch #Circ:  not applicable #PreE with SF: Patient starting to experience blurry vision and headache in the last hour. Magnesium for seizure ppx. IV Labetalol PRN severe pressures #HA: trial excedrin  Nina Daneshvar, Medical Student  03/22/2024, 1:13 AM  GME ATTESTATION:  Evaluation and management procedures were performed by myself or the medical student under my direct supervision. I was immediately available for direct supervision, assistance and direction throughout this encounter.  I also confirm that I have verified the information documented in the medical student's note, and that I have also personally reperformed the pertinent components of the physical exam and all of the medical decision making activities.  I have also made any necessary editorial changes.  Leeroy KATHEE Pouch, MD OB Fellow, Faculty Practice [facility], Center for [facility] 03/22/2024 4:12 AM

## 2024-03-22 NOTE — Discharge Summary (Signed)
 Postpartum Discharge Summary      Patient Name: Sherry Barton DOB: May 21, 2000 MRN: [id number]  Date of admission: 03/21/2024 Delivery date:03/23/2024 Delivering provider: SYNTHIA RAISIN Date of discharge: 03/25/2024  Admitting diagnosis: Severe preeclampsia, third trimester [O14.13] Preeclampsia, severe [O14.10] Intrauterine pregnancy: [redacted]w[redacted]d     Secondary diagnosis:  Principal Problem:   Severe preeclampsia, third trimester Active Problems:   Preeclampsia, severe   Vaginal delivery   Obstetric labial laceration, delivered, current hospitalization   AKI (acute kidney injury)  Additional problems: n/a    Discharge diagnosis: Term Pregnancy Delivered                                              Post partum procedures:n/a Induction: AROM, Pitocin , Cytotec , and IP Foley Complications: None  Hospital course: Induction of Labor With Vaginal Delivery   23 y.o. yo G1P1001 at [redacted]w[redacted]d was admitted to the hospital 03/21/2024 for induction of labor.  Indication for induction: Preeclampsia.  Patient had an labor course complicated by PreE. Membrane Rupture Time/Date: 2:10 PM,03/22/2024  Delivery Method:Vaginal, Spontaneous Operative Delivery:N/A Episiotomy: None Lacerations:  Labial Details of delivery can be found in separate delivery note.  Patient had a postpartum course complicated bynothing. Patient is discharged home 03/25/24.  Newborn Data: Birth date:03/23/2024 Birth time:12:53 AM Gender:Female Living status:Living Apgars:7 ,9  Weight:2660 g  Magnesium  Sulfate received: Yes: Seizure prophylaxis BMZ received: No Rhophylac:N/A MMR:N/A T-DaP:Given prenatally Flu: Yes RSV Vaccine received: No Transfusion:No  Immunizations received: Immunization History  Administered Date(s) Administered   DTaP 03/15/2006   DTaP / IPV 09/12/2007   Dtap, Unspecified 06/18/2003, 11/18/2003, 03/03/2005   HIB, Unspecified 06/18/2003, 11/18/2003, 11/29/2004   HPV 9-valent  10/14/2015   Hep A, Unspecified 04/06/2005   Hep B, Unspecified 04/08/2003, 06/18/2003, 11/29/2004   Hepatitis A, Ped/Adol-2 Dose 03/15/2006   Influenza Nasal 06/23/2010   Influenza, Seasonal, Injecte, Preservative Fre 03/15/2006, 01/15/2024   Influenza,inj,Quad PF,6+ Mos 07/29/2014   Influenza-Unspecified 03/03/2005, 04/06/2005   MMR 11/29/2004, 09/12/2007   Meningococcal B, OMV 06/21/2020, 08/19/2020   Meningococcal Conjugate 06/25/2014, 06/21/2020   PPD Test 07/12/2021   Pneumococcal Conjugate PCV 7 06/18/2003, 11/18/2003, 11/29/2004   Polio, Unspecified 06/18/2003, 11/18/2003, 03/03/2005   Tdap 06/25/2014, 01/15/2024   Varicella 11/29/2004, 09/12/2007    Physical exam  Vitals:   03/24/24 1546 03/24/24 2219 03/25/24 0007 03/25/24 0344  BP: 131/88 129/86 127/86 136/85  Pulse: 87 94 96 71  Resp: 18 18 18 18   Temp: 98.1 F (36.7 C) 98 F (36.7 C) 98.1 F (36.7 C) 98.5 F (36.9 C)  TempSrc: Oral Oral Oral Oral  SpO2: 100% 100% 100% 100%  Weight:      Height:       General: alert Lochia: appropriate Uterine Fundus: firm Incision: N/A DVT Evaluation: No evidence of DVT seen on physical exam. Labs: Lab Results  Component Value Date   WBC 13.8 (H) 03/24/2024   HGB 8.3 (L) 03/24/2024   HCT 26.6 (L) 03/24/2024   MCV 79.2 (L) 03/24/2024   PLT 221 03/24/2024      Latest Ref Rng & Units 03/24/2024    4:32 AM  CMP  Glucose 70 - 99 mg/dL 82   BUN 6 - 20 mg/dL 13   Creatinine 9.55 - 1.00 mg/dL 8.86   Sodium 864 - 854 mmol/L 138   Potassium 3.5 - 5.1 mmol/L 4.5  Chloride 98 - 111 mmol/L 106   CO2 22 - 32 mmol/L 22   Calcium 8.9 - 10.3 mg/dL 8.5   Total Protein 6.5 - 8.1 g/dL 5.6   Total Bilirubin 0.0 - 1.2 mg/dL 0.7   Alkaline Phos 38 - 126 U/L 116   AST 15 - 41 U/L 26   ALT 0 - 44 U/L 15    Edinburgh Score:    03/25/2024    4:00 AM  Edinburgh Postnatal Depression Scale Screening Tool  I have been able to laugh and see the funny side of things. 0  I have  looked forward with enjoyment to things. 0  I have blamed myself unnecessarily when things went wrong. 0  I have been anxious or worried for no good reason. 0  I have felt scared or panicky for no good reason. 0  Things have been getting on top of me. 0  I have been so unhappy that I have had difficulty sleeping. 0  I have felt sad or miserable. 0  I have been so unhappy that I have been crying. 0  The thought of harming myself has occurred to me. 0  Edinburgh Postnatal Depression Scale Total 0   Edinburgh Postnatal Depression Scale Total: 0   After visit meds:  Allergies as of 03/25/2024       Reactions   Compazine  [prochlorperazine ] Other (See Comments)   Pt states it makes her feel off, and she has weird dreams while taking        Medication List     STOP taking these medications    ACCRUFeR  30 MG Caps Generic drug: Ferric Maltol        TAKE these medications    Blood Pressure Kit Devi 1 kit by Does not apply route once a week.   Doxylamine -Pyridoxine  10-10 MG Tbec Commonly known as: Diclegis  Take 2 tablets by mouth at bedtime. If symptoms persist, add one tablet in the morning and one in the afternoon   ferrous sulfate  325 (65 FE) MG tablet Take 1 tablet (325 mg total) by mouth every other day for 30 doses. Start taking on: March 26, 2024 What changed:  medication strength how much to take when to take this   furosemide  20 MG tablet Commonly known as: LASIX  Take 1 tablet (20 mg total) by mouth daily for 3 days.   ibuprofen  600 MG tablet Commonly known as: ADVIL  Take 1 tablet (600 mg total) by mouth every 6 (six) hours as needed for fever, mild pain (pain score 1-3), moderate pain (pain score 4-6) or cramping.   multivitamin-prenatal 27-0.8 MG Tabs tablet Take 1 tablet by mouth daily at 12 noon.   NIFEdipine  30 MG 24 hr tablet Commonly known as: ADALAT  CC Take 1 tablet (30 mg total) by mouth daily.   ondansetron  4 MG disintegrating  tablet Commonly known as: ZOFRAN -ODT Take 1-2 tablets (4-8 mg total) by mouth every 6 (six) hours as needed for nausea or vomiting.   potassium chloride  SA 20 MEQ tablet Commonly known as: KLOR-CON  M Take 1 tablet (20 mEq total) by mouth daily for 3 days.   scopolamine  1 MG/3DAYS Commonly known as: TRANSDERM-SCOP Place 1 patch (1.5 mg total) onto the skin every 3 (three) days.         Discharge home in stable condition Infant Feeding: Breast Infant Disposition:home with mother Discharge instruction: per After Visit Summary and Postpartum booklet. Activity: Advance as tolerated. Pelvic rest for 6 weeks.  Diet: routine diet  Future Appointments: Future Appointments  Date Time Provider Department Center  03/31/2024  2:20 PM [facility] NURSE [facility] None  05/05/2024  3:30 PM Delores Nidia CROME, FNP [facility] None   Follow up Visit:  Follow-up Information     [facility] Follow up on 03/31/2024.   Specialty: Obstetrics and Gynecology Why: blood pressure check Contact information: [address] [place]  [address] [phone]                 Please schedule this patient for a In person postpartum visit in 6 weeks with the following provider: Any provider. Additional Postpartum F/U:BP check 1 week  High risk pregnancy complicated by: PreEclampsia with SF Delivery mode:  Vaginal, Spontaneous Anticipated Birth Control:  Patch   03/25/2024 Bebe Furry, MD

## 2024-03-22 NOTE — MA[facility] Provider Note (Addendum)
 Sherry Barton is a 23 y.o. G1P0 female at [redacted]w[redacted]d  Seen by Provider ( I personally evaluated the patient and interpreted the NST) SVE by RN: Dilation: 1 Effacement (%): Thick Exam by:: L. Latangela Mccomas, NP NST: FHR baseline 130 bpm, Variability: moderate, Accelerations:present, Decelerations:  Absent= Cat 1/Reactive Toco: irregular, every 5-7 minutes  D/C home  Sherry Barton CNM, WHNP-BC 03/22/2024 1:26 AM     RN's notified me that at 0024 BP's at discharge were elevated although patient denies any HA's, visual changes, RUQ pain, SOB, or chest pain. Will order [facility] labs and continue to cycle BP's at this time   Vitals:   03/21/24 2252 03/21/24 2332 03/22/24 0009 03/22/24 0020  BP: 128/79 128/82 (!) 165/94 (!) 168/110   03/22/24 0021 03/22/24 0036 03/22/24 0046  BP: (!) 176/97 (!) 160/111 (!) 169/99       [name] is a 23 y.o. G1P0 patient who presents to MAU today with complaint of contractions occurring every 5 to 7 minutes since 2147 Patient denies any vaginal bleeding, leaking of fluid and reports good fetal movements.  She initially denied any headaches, right upper quadrant pain, visual changes, shortness of breath, or chest pain.  Her NST was reactive and her cervical exam was unchanged from 1/60/ ballotable.     O BP (!) 169/99 (BP Location: Right Arm)   Pulse 64   Temp 98.3 F (36.8 C) (Oral)   Resp 12   Ht 5' 3 (1.6 m)   Wt 57.6 kg   LMP 06/21/2023 (Exact Date)   SpO2 99%   BMI 22.50 kg/m  Physical Exam Vitals and nursing note reviewed. Exam conducted with a chaperone present.  Constitutional:      General: She is not in acute distress.    Appearance: She is not ill-appearing.  HENT:     Head: Normocephalic.     Nose: Nose normal.     Mouth/Throat:     Mouth: Mucous membranes are moist.  Pulmonary:     Effort: Pulmonary effort is normal.  Abdominal:     Palpations: Abdomen is soft.     Tenderness: There is no abdominal tenderness.   Musculoskeletal:     Cervical back: Normal range of motion.  Skin:    General: Skin is warm.  Neurological:     Mental Status: She is alert and oriented to person, place, and time.  Psychiatric:        Mood and Affect: Affect is flat.    Her NST was reactive and her cervical exam was unchanged from 1/60/ ballotable.     MDM  HIGH-> Admit for PreE with SF based on Severe Range Blood pressures  PIH Labs pending BP's are in Severe range requiring treatment Magnesium for seizure ppx IOL recommended at this time Discussed with L/D Team and aware patient is to be admitted for IOL    Pt informed that the ultrasound is considered a limited OB ultrasound and is not intended to be a complete ultrasound exam.  Patient also informed that the ultrasound is not being completed with the intent of assessing for fetal or placental anomalies or any pelvic abnormalities.  Explained that the purpose of today's ultrasound is to assess for  presentation.  Patient acknowledges the purpose of the exam and the limitations of the study.   Vertex confirmed    Orders Placed This Encounter  Procedures   CBC    Standing Status:   Standing    Number  of Occurrences:   1   Comprehensive metabolic panel    Standing Status:   Standing    Number of Occurrences:   1   Protein / creatinine ratio, urine    Standing Status:   Standing    Number of Occurrences:   1   Urinalysis, Routine w reflex microscopic -Urine, Random    Standing Status:   Standing    Number of Occurrences:   1    Specimen Source:   Urine, Random [244]   Urinalysis, Microscopic (reflex)    Standing Status:   Standing    Number of Occurrences:   1   RPR    Standing Status:   Standing    Number of Occurrences:   1   Diet clear liquid Room service appropriate? Yes; Fluid consistency: Thin    Standing Status:   Standing    Number of Occurrences:   1    Room service appropriate?:   Yes    Fluid consistency::   Thin   Contraction -  monitoring    Standing Status:   Standing    Number of Occurrences:   1   External fetal heart monitoring    Standing Status:   Standing    Number of Occurrences:   1   Vaginal exam    Standing Status:   Standing    Number of Occurrences:   1   Notify physician (specify) Confirmatory reading of BP> 160/110 15 minutes later    Confirmatory reading of BP> 160/110 15 minutes later    Standing Status:   Standing    Number of Occurrences:   1    Notify Physician:   Temp greater than or equal to 100.4    Notify Physician:   RR greater than 24 or less than 10    Notify Physician:   HR greater than 120 or less than 50    Notify Physician:   SBP greater than 160 mmHG or less than 80 mmHG    Notify Physician:   DBP greater than 110 mmHG or less than 45 mmHG    Notify Physician:   Urinary output is less than for any 4 hour period   Apply Hypertensive Disorders of Pregnancy Care Plan    Standing Status:   Standing    Number of Occurrences:   1   Vital signs    Standing Status:   Standing    Number of Occurrences:   1   Measure blood pressure    10 minutes after giving labetalol 40 MG IV dose.  Call MD if SBP >/= 160 or DBP >/= 110.    Standing Status:   Standing    Number of Occurrences:   1   Vitals signs per unit policy    Standing Status:   Standing    Number of Occurrences:   1   Fetal monitoring per unit policy    Standing Status:   Standing    Number of Occurrences:   1   Full code    Standing Status:   Standing    Number of Occurrences:   1    By::   Consent: discussion documented in EHR   Type and screen [facility]     Standing Status:   Standing    Number of Occurrences:   1   Saline lock IV    Standing Status:   Standing  Number of Occurrences:   1   Insert and maintain IV Line    Standing Status:   Standing    Number of Occurrences:   1      Results for orders placed or performed during the hospital  encounter of 03/21/24 (from the past 24 hours)  Protein / creatinine ratio, urine     Status: Abnormal   Collection Time: 03/22/24 12:35 AM  Result Value Ref Range   Creatinine, Urine 158 mg/dL   Total Protein, Urine 31 mg/dL   Protein Creatinine Ratio 0.20 (H) 0.00 - 0.15 mg/mg[Cre]  Urinalysis, Routine w reflex microscopic -Urine, Random     Status: Abnormal   Collection Time: 03/22/24 12:35 AM  Result Value Ref Range   Color, Urine YELLOW YELLOW   APPearance CLEAR CLEAR   Specific Gravity, Urine 1.020 1.005 - 1.030   pH 7.0 5.0 - 8.0   Glucose, UA NEGATIVE NEGATIVE mg/dL   Hgb urine dipstick NEGATIVE NEGATIVE   Bilirubin Urine SMALL (A) NEGATIVE   Ketones, ur >80 (A) NEGATIVE mg/dL   Protein, ur 30 (A) NEGATIVE mg/dL   Nitrite NEGATIVE NEGATIVE   Leukocytes,Ua TRACE (A) NEGATIVE  Urinalysis, Microscopic (reflex)     Status: None   Collection Time: 03/22/24 12:35 AM  Result Value Ref Range   RBC / HPF 0-5 0 - 5 RBC/hpf   WBC, UA 0-5 0 - 5 WBC/hpf   Bacteria, UA NONE SEEN NONE SEEN   Squamous Epithelial / HPF 0-5 0 - 5 /HPF  CBC     Status: Abnormal   Collection Time: 03/22/24 12:43 AM  Result Value Ref Range   WBC 7.7 4.0 - 10.5 K/uL   RBC 4.17 3.87 - 5.11 MIL/uL   Hemoglobin 10.0 (L) 12.0 - 15.0 g/dL   HCT 67.4 (L) 63.9 - 53.9 %   MCV 77.9 (L) 80.0 - 100.0 fL   MCH 24.0 (L) 26.0 - 34.0 pg   MCHC 30.8 30.0 - 36.0 g/dL   RDW 83.9 (H) 88.4 - 84.4 %   Platelets 209 150 - 400 K/uL   nRBC 0.7 (H) 0.0 - 0.2 %  Comprehensive metabolic panel     Status: Abnormal   Collection Time: 03/22/24 12:43 AM  Result Value Ref Range   Sodium 134 (L) 135 - 145 mmol/L   Potassium 4.2 3.5 - 5.1 mmol/L   Chloride 100 98 - 111 mmol/L   CO2 20 (L) 22 - 32 mmol/L   Glucose, Bld 78 70 - 99 mg/dL   BUN 7 6 - 20 mg/dL   Creatinine, Ser 9.37 0.44 - 1.00 mg/dL   Calcium 9.7 8.9 - 89.6 mg/dL   Total Protein 6.7 6.5 - 8.1 g/dL   Albumin 2.9 (L) 3.5 - 5.0 g/dL   AST 19 15 - 41 U/L   ALT 10 0 -  44 U/L   Alkaline Phosphatase 150 (H) 38 - 126 U/L   Total Bilirubin 0.8 0.0 - 1.2 mg/dL   GFR, Estimated >39 >39 mL/min   Anion gap 14 5 - 15     ASSESSMENT Medical screening exam complete  Preeclampsia, severe, third trimester  Anemia during pregnancy in third trimester  [redacted] weeks gestation of pregnancy  NST (non-stress test) reactive on fetal surveillance    PLAN Admission to L/D for IOL Routine admission orders placed Magnesium for seizure ppx Antihypertensive protocol in place Discussed with Harlene Duncans CNM and OB Labor and Delivery Team  Littie Sherry LABOR, NP 03/22/2024  1:26 AM   This chart was dictated using voice recognition software, Dragon. Despite the best efforts of this provider to proofread and correct errors, errors may still occur which can change documentation meaning.

## 2024-03-22 NOTE — Progress Notes (Signed)
 LABOR PROGRESS NOTE Pt rechecked at 1800 Patient comfortable with epidural. Pit at 8.  SCE: 6/90/-1. Significant asymmetric dilation, cervix more prominent on maternal left FHT: baseline 110, min to mod variability, +accels, -decels; overall category II. Toco: q2-6 min  A/P:  Reposition Continue to titrate pitocin per protocol  Barabara Maier, DO 6:07 PM

## 2024-03-22 NOTE — Anesthesia Procedure Notes (Signed)
 Epidural Patient location during procedure: OB Start time: 03/22/2024 12:01 PM End time: 03/22/2024 12:10 PM  Staffing Anesthesiologist: Jerrye Sharper, MD Performed: anesthesiologist   Preanesthetic Checklist Completed: patient identified, IV checked, site marked, risks and benefits discussed, surgical consent, monitors and equipment checked, pre-op evaluation and timeout performed  Epidural Patient position: sitting Prep: DuraPrep and site prepped and draped Patient monitoring: continuous pulse ox and blood pressure Approach: midline Location: L3-L4 Injection technique: LOR air  Needle:  Needle type: Tuohy  Needle gauge: 17 G Needle length: 9 cm and 9 Needle insertion depth: 5 cm Catheter type: closed end flexible Catheter size: 19 Gauge Catheter at skin depth: 10 cm Test dose: negative and Other  Assessment Events: blood not aspirated, no cerebrospinal fluid, injection not painful, no injection resistance, no paresthesia and negative IV test  Additional Notes Patient identified. Risks and benefits discussed including failed block, incomplete  Pain control, post dural puncture headache, nerve damage, paralysis, blood pressure Changes, nausea, vomiting, reactions to medications-both toxic and allergic and post Partum back pain. All questions were answered. Patient expressed understanding and wished to proceed. Sterile technique was used throughout procedure. Epidural site was Dressed with sterile barrier dressing. No paresthesias, signs of intravascular injection Or signs of intrathecal spread were encountered.  Patient was more comfortable after the epidural was dosed. Please see RN's note for documentation of vital signs and FHR which are stable. Reason for block:procedure for pain

## 2024-03-23 LAB — CBC
HCT: 34.3 % — ABNORMAL LOW (ref 36.0–46.0)
Hemoglobin: 10.8 g/dL — ABNORMAL LOW (ref 12.0–15.0)
MCH: 23.9 pg — ABNORMAL LOW (ref 26.0–34.0)
MCHC: 31.5 g/dL (ref 30.0–36.0)
MCV: 76.1 fL — ABNORMAL LOW (ref 80.0–100.0)
Platelets: 247 K/uL (ref 150–400)
RBC: 4.51 MIL/uL (ref 3.87–5.11)
RDW: 16.5 % — ABNORMAL HIGH (ref 11.5–15.5)
WBC: 23.8 K/uL — ABNORMAL HIGH (ref 4.0–10.5)
nRBC: 0.2 % (ref 0.0–0.2)

## 2024-03-23 LAB — COMPREHENSIVE METABOLIC PANEL WITH GFR
ALT: 15 U/L (ref 0–44)
AST: 32 U/L (ref 15–41)
Albumin: 2.2 g/dL — ABNORMAL LOW (ref 3.5–5.0)
Alkaline Phosphatase: 121 U/L (ref 38–126)
Anion gap: 7 (ref 5–15)
BUN: 6 mg/dL (ref 6–20)
CO2: 21 mmol/L — ABNORMAL LOW (ref 22–32)
Calcium: 7.9 mg/dL — ABNORMAL LOW (ref 8.9–10.3)
Chloride: 103 mmol/L (ref 98–111)
Creatinine, Ser: 1.03 mg/dL — ABNORMAL HIGH (ref 0.44–1.00)
GFR, Estimated: >60 mL/min (ref >60)
Glucose, Bld: 112 mg/dL — ABNORMAL HIGH (ref 70–99)
Potassium: 4.3 mmol/L (ref 3.5–5.1)
Sodium: 131 mmol/L — ABNORMAL LOW (ref 135–145)
Total Bilirubin: 0.6 mg/dL (ref 0.0–1.2)
Total Protein: 5.6 g/dL — ABNORMAL LOW (ref 6.5–8.1)

## 2024-03-23 LAB — MAGNESIUM
Magnesium: >9 mg/dL (ref 1.7–2.4)
Magnesium: 5.4 mg/dL — ABNORMAL HIGH (ref 1.7–2.4)

## 2024-03-23 MED ORDER — WITCH HAZEL-GLYCERIN EX PADS: 1.0000 | MEDICATED_PAD | CUTANEOUS | Status: DC | PRN

## 2024-03-23 MED ORDER — DIBUCAINE (PERIANAL) 1 % EX OINT: 1.0000 | TOPICAL_OINTMENT | CUTANEOUS | Status: DC | PRN

## 2024-03-23 MED ORDER — TRANEXAMIC ACID-NACL 1000-0.7 MG/100ML-% IV SOLN
INTRAVENOUS | Status: AC
  Administered 2024-03-23: 1000 mg via INTRAVENOUS
  Filled 2024-03-23: qty 100

## 2024-03-23 MED ORDER — SIMETHICONE 80 MG PO CHEW: 80.0000 mg | CHEWABLE_TABLET | ORAL | Status: DC | PRN

## 2024-03-23 MED ORDER — TETANUS-DIPHTH-ACELL PERTUSSIS 5-2-15.5 LF-MCG/0.5 IM SUSP: 0.5000 mL | Freq: Once | INTRAMUSCULAR | Status: DC

## 2024-03-23 MED ORDER — ACETAMINOPHEN 325 MG PO TABS
650.0000 mg | ORAL_TABLET | ORAL | Status: DC | PRN
  Administered 2024-03-24 (×2): 650 mg via ORAL
  Filled 2024-03-23 (×3): qty 2

## 2024-03-23 MED ORDER — ONDANSETRON HCL 4 MG/2ML IJ SOLN: 4.0000 mg | INTRAMUSCULAR | Status: DC | PRN

## 2024-03-23 MED ORDER — NIFEDIPINE 10 MG PO CAPS
10.0000 mg | ORAL_CAPSULE | Freq: Once | ORAL | Status: DC
Start: 2024-03-23 — End: 2024-03-23

## 2024-03-23 MED ORDER — COCONUT OIL OIL: 1.0000 | TOPICAL_OIL | Status: DC | PRN

## 2024-03-23 MED ORDER — IBUPROFEN 600 MG PO TABS
600.0000 mg | ORAL_TABLET | Freq: Four times a day (QID) | ORAL | Status: DC
  Administered 2024-03-23 – 2024-03-25 (×8): 600 mg via ORAL
  Filled 2024-03-23 (×10): qty 1

## 2024-03-23 MED ORDER — ONDANSETRON HCL 4 MG PO TABS: 4.0000 mg | ORAL_TABLET | ORAL | Status: DC | PRN

## 2024-03-23 MED ORDER — TRANEXAMIC ACID-NACL 1000-0.7 MG/100ML-% IV SOLN: 1000.0000 mg | INTRAVENOUS | Status: DC

## 2024-03-23 MED ORDER — SENNOSIDES-DOCUSATE SODIUM 8.6-50 MG PO TABS
2.0000 | ORAL_TABLET | ORAL | Status: DC
  Administered 2024-03-23 – 2024-03-25 (×3): 2 via ORAL
  Filled 2024-03-23 (×3): qty 2

## 2024-03-23 MED ORDER — PRENATAL MULTIVITAMIN CH
1.0000 | ORAL_TABLET | Freq: Every day | ORAL | Status: DC
  Administered 2024-03-23 – 2024-03-25 (×3): 1 via ORAL
  Filled 2024-03-23 (×3): qty 1

## 2024-03-23 MED ORDER — DIPHENHYDRAMINE HCL 25 MG PO CAPS: 25.0000 mg | ORAL_CAPSULE | Freq: Four times a day (QID) | ORAL | Status: DC | PRN

## 2024-03-23 MED ORDER — ZOLPIDEM TARTRATE 5 MG PO TABS: 5.0000 mg | ORAL_TABLET | Freq: Every evening | ORAL | Status: DC | PRN

## 2024-03-23 MED ORDER — NIFEDIPINE 10 MG PO CAPS
10.0000 mg | ORAL_CAPSULE | ORAL | Status: DC | PRN
  Administered 2024-03-23: 10 mg via ORAL
  Filled 2024-03-23: qty 1

## 2024-03-23 MED ORDER — NIFEDIPINE 10 MG PO CAPS: 10.0000 mg | ORAL_CAPSULE | Freq: Once | ORAL | Status: DC

## 2024-03-23 MED ORDER — FERROUS SULFATE 325 (65 FE) MG PO TABS
325.0000 mg | ORAL_TABLET | Freq: Two times a day (BID) | ORAL | Status: DC
  Administered 2024-03-23 – 2024-03-24 (×4): 325 mg via ORAL
  Filled 2024-03-23 (×5): qty 1

## 2024-03-23 MED ORDER — BENZOCAINE-MENTHOL 20-0.5 % EX AERO
1.0000 | INHALATION_SPRAY | CUTANEOUS | Status: DC | PRN
  Administered 2024-03-23: 1 via TOPICAL
  Filled 2024-03-23: qty 56

## 2024-03-23 MED ORDER — NIFEDIPINE ER OSMOTIC RELEASE 30 MG PO TB24
30.0000 mg | ORAL_TABLET | Freq: Every day | ORAL | Status: DC
  Administered 2024-03-23 – 2024-03-25 (×3): 30 mg via ORAL
  Filled 2024-03-23 (×3): qty 1

## 2024-03-23 MED ORDER — MAGNESIUM SULFATE 40 GM/1000ML IV SOLN: 2.0000 g/h | INTRAVENOUS | Status: DC

## 2024-03-23 NOTE — Plan of Care (Signed)
  Problem: Education: Goal: Knowledge of disease or condition will improve Outcome: Progressing Goal: Knowledge of the prescribed therapeutic regimen will improve Outcome: Progressing   Problem: Fluid Volume: Goal: Peripheral tissue perfusion will improve Outcome: Progressing   Problem: Clinical Measurements: Goal: Complications related to disease process, condition or treatment will be avoided or minimized Outcome: Progressing   Problem: Education: Goal: Knowledge of General Education information will improve Description: Including pain rating scale, medication(s)/side effects and non-pharmacologic comfort measures Outcome: Progressing   Problem: Health Behavior/Discharge Planning: Goal: Ability to manage health-related needs will improve Outcome: Progressing   Problem: Clinical Measurements: Goal: Ability to maintain clinical measurements within normal limits will improve Outcome: Progressing Goal: Will remain free from infection Outcome: Progressing Goal: Diagnostic test results will improve Outcome: Progressing Goal: Respiratory complications will improve Outcome: Progressing Goal: Cardiovascular complication will be avoided Outcome: Progressing   Problem: Activity: Goal: Risk for activity intolerance will decrease Outcome: Progressing   Problem: Nutrition: Goal: Adequate nutrition will be maintained Outcome: Progressing   Problem: Coping: Goal: Level of anxiety will decrease Outcome: Progressing   Problem: Elimination: Goal: Will not experience complications related to bowel motility Outcome: Progressing Goal: Will not experience complications related to urinary retention Outcome: Progressing   Problem: Pain Managment: Goal: General experience of comfort will improve and/or be controlled Outcome: Progressing   Problem: Safety: Goal: Ability to remain free from injury will improve Outcome: Progressing   Problem: Skin Integrity: Goal: Risk for impaired  skin integrity will decrease Outcome: Progressing   Problem: Education: Goal: Knowledge of Childbirth will improve Outcome: Progressing Goal: Ability to make informed decisions regarding treatment and plan of care will improve Outcome: Progressing Goal: Ability to state and carry out methods to decrease the pain will improve Outcome: Progressing Goal: Individualized Educational Video(s) Outcome: Progressing   Problem: Coping: Goal: Ability to verbalize concerns and feelings about labor and delivery will improve Outcome: Progressing   Problem: Life Cycle: Goal: Ability to make normal progression through stages of labor will improve Outcome: Progressing Goal: Ability to effectively push during vaginal delivery will improve Outcome: Progressing   Problem: Role Relationship: Goal: Will demonstrate positive interactions with the child Outcome: Progressing   Problem: Safety: Goal: Risk of complications during labor and delivery will decrease Outcome: Progressing   Problem: Pain Management: Goal: Relief or control of pain from uterine contractions will improve Outcome: Progressing   Problem: Education: Goal: Knowledge of condition will improve Outcome: Progressing Goal: Individualized Educational Video(s) Outcome: Progressing Goal: Individualized Newborn Educational Video(s) Outcome: Progressing   Problem: Activity: Goal: Will verbalize the importance of balancing activity with adequate rest periods Outcome: Progressing Goal: Ability to tolerate increased activity will improve Outcome: Progressing   Problem: Coping: Goal: Ability to identify and utilize available resources and services will improve Outcome: Progressing   Problem: Life Cycle: Goal: Chance of risk for complications during the postpartum period will decrease Outcome: Progressing   Problem: Role Relationship: Goal: Ability to demonstrate positive interaction with newborn will improve Outcome:  Progressing   Problem: Skin Integrity: Goal: Demonstration of wound healing without infection will improve Outcome: Progressing

## 2024-03-23 NOTE — Anesthesia Postprocedure Evaluation (Signed)
 Anesthesia Post Note  Patient: Sherry Barton  Procedure(s) Performed: AN AD HOC LABOR EPIDURAL     Patient location during evaluation: Mother Baby Anesthesia Type: Epidural Level of consciousness: awake, oriented and awake and alert Pain management: satisfactory to patient Vital Signs Assessment: post-procedure vital signs reviewed and stable Respiratory status: spontaneous breathing, nonlabored ventilation and respiratory function stable Cardiovascular status: stable and blood pressure returned to baseline Postop Assessment: no headache, no backache, patient able to bend at knees, no apparent nausea or vomiting, adequate PO intake and able to ambulate Anesthetic complications: no   No notable events documented.  Last Vitals:  Vitals:   03/23/24 1506 03/23/24 1600  BP:  (!) 144/93  Pulse:  94  Resp: 17 18  Temp:  36.8 C  SpO2:  100%    Last Pain:  Vitals:   03/23/24 1600  TempSrc: Oral  PainSc:    Pain Goal:                   [name]

## 2024-03-23 NOTE — Lactation Note (Signed)
 This note was copied from a baby's chart. Lactation Consultation Note  Patient Name: Sherry Barton Today's Date: 03/23/2024 Age:21 hours Reason for consult: Initial assessment;Primapara;1st time breastfeeding;Infant < 6lbs;Early term 37-38.6wks;Other (Comment) (Pre-E)  [facility] RN Roxann O called this LC to notify that patient has decided not to breastfeed, she'll just do formula. RN has also reviewed lactation suppression with [name] who is a P1. Lactation services are completed at this time.  Feeding Mother's Current Feeding Choice: Formula  Discharge Discharge Education: Engorgement and breast care  Consult Status Consult Status: Complete Date: 03/23/24 Follow-up type: Call as needed   Jennings Corado GORMAN Crate 03/23/2024, 9:29 AM

## 2024-03-24 ENCOUNTER — Encounter (HOSPITAL_COMMUNITY): Payer: Self-pay | Admitting: Obstetrics and Gynecology

## 2024-03-24 LAB — CBC
HCT: 26.6 % — ABNORMAL LOW (ref 36.0–46.0)
Hemoglobin: 8.3 g/dL — ABNORMAL LOW (ref 12.0–15.0)
MCH: 24.7 pg — ABNORMAL LOW (ref 26.0–34.0)
MCHC: 31.2 g/dL (ref 30.0–36.0)
MCV: 79.2 fL — ABNORMAL LOW (ref 80.0–100.0)
Platelets: 221 K/uL (ref 150–400)
RBC: 3.36 MIL/uL — ABNORMAL LOW (ref 3.87–5.11)
RDW: 17.2 % — ABNORMAL HIGH (ref 11.5–15.5)
WBC: 13.8 K/uL — ABNORMAL HIGH (ref 4.0–10.5)
nRBC: 0.1 % (ref 0.0–0.2)

## 2024-03-24 LAB — COMPREHENSIVE METABOLIC PANEL WITH GFR
ALT: 15 U/L (ref 0–44)
AST: 26 U/L (ref 15–41)
Albumin: 2.3 g/dL — ABNORMAL LOW (ref 3.5–5.0)
Alkaline Phosphatase: 116 U/L (ref 38–126)
Anion gap: 10 (ref 5–15)
BUN: 13 mg/dL (ref 6–20)
CO2: 22 mmol/L (ref 22–32)
Calcium: 8.5 mg/dL — ABNORMAL LOW (ref 8.9–10.3)
Chloride: 106 mmol/L (ref 98–111)
Creatinine, Ser: 1.13 mg/dL — ABNORMAL HIGH (ref 0.44–1.00)
GFR, Estimated: >60 mL/min (ref >60)
Glucose, Bld: 82 mg/dL (ref 70–99)
Potassium: 4.5 mmol/L (ref 3.5–5.1)
Sodium: 138 mmol/L (ref 135–145)
Total Bilirubin: 0.7 mg/dL (ref 0.0–1.2)
Total Protein: 5.6 g/dL — ABNORMAL LOW (ref 6.5–8.1)

## 2024-03-24 LAB — MAGNESIUM: Magnesium: 3.9 mg/dL — ABNORMAL HIGH (ref 1.7–2.4)

## 2024-03-24 MED ORDER — SODIUM CHLORIDE 0.9 % IV SOLN
300.0000 mg | Freq: Once | INTRAVENOUS | Status: AC
  Administered 2024-03-24: 300 mg via INTRAVENOUS
  Filled 2024-03-24: qty 15

## 2024-03-24 MED ORDER — IRON SUCROSE 300 MG IVPB - SIMPLE MED
300.0000 mg | Freq: Once | Status: DC
  Filled 2024-03-24: qty 265

## 2024-03-24 MED ORDER — FUROSEMIDE 20 MG PO TABS
20.0000 mg | ORAL_TABLET | Freq: Every day | ORAL | Status: DC
  Administered 2024-03-24 – 2024-03-25 (×2): 20 mg via ORAL
  Filled 2024-03-24 (×2): qty 1

## 2024-03-24 MED ORDER — POTASSIUM CHLORIDE CRYS ER 20 MEQ PO TBCR
20.0000 meq | EXTENDED_RELEASE_TABLET | Freq: Every day | ORAL | Status: DC
  Administered 2024-03-24 – 2024-03-25 (×2): 20 meq via ORAL
  Filled 2024-03-24 (×2): qty 1

## 2024-03-24 NOTE — Progress Notes (Signed)
 POSTPARTUM PROGRESS NOTE  Post Partum Day : 1  Subjective:  Sherry Barton is a 23 y.o. G1P0 s/p SVD at [redacted]w[redacted]d.  She reports she is doing well. No acute events overnight. She denies any problems with ambulating, voiding or po intake. Denies nausea or vomiting.  Pain is well controlled.  Lochia is normal.  Objective: Blood pressure 127/73, pulse 69, temperature 98.3 F (36.8 C), temperature source Oral, resp. rate 18, height 5' 3 (1.6 m), weight 57.6 kg, last menstrual period 06/21/2023, SpO2 100%.  Physical Exam:  General: alert, cooperative and no distress Chest: no respiratory distress, CTAB Heart:regular rate, distal and rhythm Abdomen: soft, nontender, nondistended Uterine Fundus: firm, appropriately tender DVT Evaluation: No calf swelling or tenderness Extremities: trace edema Skin: warm, dry  Recent Labs    03/23/24 0303 03/24/24 0432  HGB 10.8* 8.3*  HCT 34.3* 26.6*    Assessment/Plan: Sherry Barton is a 23 y.o. G1P0 s/p SVD at [redacted]w[redacted]d   PPD#1 - Doing well  Routine postpartum care Will give IV venofer for anemia Contraception: unsure Feeding: breast/bottle Dispo: anticipate d/c on 03/25/24.   LOS: 2 days   Jerilynn Buddle, MD Faculty attending 03/24/2024, 9:00 AM

## 2024-03-25 ENCOUNTER — Other Ambulatory Visit (HOSPITAL_COMMUNITY): Payer: Self-pay

## 2024-03-25 ENCOUNTER — Encounter: Payer: Self-pay | Admitting: Advanced Practice Midwife

## 2024-03-25 DIAGNOSIS — N179 Acute kidney failure, unspecified: Secondary | ICD-10-CM | POA: Diagnosis not present

## 2024-03-25 LAB — COMPREHENSIVE METABOLIC PANEL WITH GFR
ALT: 15 U/L (ref 0–44)
AST: 21 U/L (ref 15–41)
Albumin: 2.4 g/dL — ABNORMAL LOW (ref 3.5–5.0)
Alkaline Phosphatase: 90 U/L (ref 38–126)
Anion gap: 10 (ref 5–15)
BUN: 13 mg/dL (ref 6–20)
CO2: 24 mmol/L (ref 22–32)
Calcium: 8.9 mg/dL (ref 8.9–10.3)
Chloride: 101 mmol/L (ref 98–111)
Creatinine, Ser: 0.83 mg/dL (ref 0.44–1.00)
GFR, Estimated: >60 mL/min (ref >60)
Glucose, Bld: 67 mg/dL — ABNORMAL LOW (ref 70–99)
Potassium: 4.7 mmol/L (ref 3.5–5.1)
Sodium: 135 mmol/L (ref 135–145)
Total Bilirubin: 0.4 mg/dL (ref 0.0–1.2)
Total Protein: 5.7 g/dL — ABNORMAL LOW (ref 6.5–8.1)

## 2024-03-25 LAB — MISC LABCORP TEST (SEND OUT): Labcorp test code: 83935

## 2024-03-25 MED ORDER — POTASSIUM CHLORIDE CRYS ER 20 MEQ PO TBCR
20.0000 meq | EXTENDED_RELEASE_TABLET | Freq: Every day | ORAL | 0 refills | Status: AC
  Filled 2024-03-25: qty 3, 3d supply, fill #0

## 2024-03-25 MED ORDER — FERROUS SULFATE 325 (65 FE) MG PO TABS: 325.0000 mg | ORAL_TABLET | ORAL | 0 refills | Status: DC

## 2024-03-25 MED ORDER — IBUPROFEN 600 MG PO TABS: 600.0000 mg | ORAL_TABLET | Freq: Four times a day (QID) | ORAL | 0 refills | Status: DC | PRN

## 2024-03-25 MED ORDER — NIFEDIPINE ER 30 MG PO TB24
30.0000 mg | ORAL_TABLET | Freq: Every day | ORAL | 0 refills | Status: AC
  Filled 2024-03-25: qty 42, 42d supply, fill #0

## 2024-03-25 MED ORDER — FERROUS SULFATE 325 (65 FE) MG PO TABS: 325.0000 mg | ORAL_TABLET | ORAL | Status: DC

## 2024-03-25 MED ORDER — IBUPROFEN 600 MG PO TABS
600.0000 mg | ORAL_TABLET | Freq: Four times a day (QID) | ORAL | 0 refills | Status: AC | PRN
  Filled 2024-03-25: qty 30, 8d supply, fill #0

## 2024-03-25 MED ORDER — FERROUS SULFATE 325 (65 FE) MG PO TABS
325.0000 mg | ORAL_TABLET | ORAL | 0 refills | Status: AC
  Filled 2024-03-25: qty 30, 60d supply, fill #0

## 2024-03-25 MED ORDER — FUROSEMIDE 20 MG PO TABS
20.0000 mg | ORAL_TABLET | Freq: Every day | ORAL | 0 refills | Status: AC
  Filled 2024-03-25: qty 3, 3d supply, fill #0

## 2024-03-25 MED ORDER — POTASSIUM CHLORIDE CRYS ER 20 MEQ PO TBCR: 20.0000 meq | EXTENDED_RELEASE_TABLET | Freq: Every day | ORAL | 0 refills | Status: DC

## 2024-03-25 MED ORDER — NIFEDIPINE ER 30 MG PO TB24: 30.0000 mg | ORAL_TABLET | Freq: Every day | ORAL | 0 refills | Status: DC

## 2024-03-25 MED ORDER — FUROSEMIDE 20 MG PO TABS: 20.0000 mg | ORAL_TABLET | Freq: Every day | ORAL | 0 refills | Status: DC

## 2024-03-25 NOTE — Discharge Instructions (Signed)
Continue to check your blood pressures twice a day Call the office for blood pressures that are consistently above 140 for the top number or 90 for the bottom number   Hypertension During Pregnancy Hypertension is also called high blood pressure. High blood pressure means that the force of your blood moving in your body is too strong. It can cause problems for you and your baby. Different types of high blood pressure can happen during pregnancy. The types are: High blood pressure before you got pregnant. This is called chronic hypertension.  This can continue during your pregnancy. Your doctor will want to keep checking your blood pressure. You may need medicine to keep your blood pressure under control while you are pregnant. You will need follow-up visits after you have your baby. High blood pressure that goes up during pregnancy when it was normal before. This is called gestational hypertension. It will usually get better after you have your baby, but your doctor will need to watch your blood pressure to make sure that it is getting better. Very high blood pressure during pregnancy. This is called preeclampsia. Very high blood pressure is an emergency that needs to be checked and treated right away. You may develop very high blood pressure after giving birth. This is called postpartum preeclampsia. This usually occurs within 48 hours after childbirth but may occur up to 6 weeks after giving birth. This is rare. How does this affect me? If you have high blood pressure during pregnancy, you have a higher chance of developing high blood pressure: As you get older. If you get pregnant again. In some cases, high blood pressure during pregnancy can cause: Stroke. Heart attack. Damage to the kidneys, lungs, or liver. Preeclampsia. Jerky movements you cannot control (convulsions or seizures). Problems with the placenta.  What can I do to lower my risk?  Keep a healthy weight. Eat a healthy  diet. Follow what your doctor tells you about treating any medical problems that you had before becoming pregnant. It is very important to go to all of your doctor visits. Your doctor will check your blood pressure and make sure that your pregnancy is progressing as it should. Treatment should start early if a problem is found.  Follow these instructions at home:  Take your blood pressure 1-2 times per day. Call the office if your blood pressure is 155 or higher for the top number or 105 or higher for the bottom number.    Eating and drinking  Drink enough fluid to keep your pee (urine) pale yellow. Avoid caffeine. Lifestyle Do not use any products that contain nicotine or tobacco, such as cigarettes, e-cigarettes, and chewing tobacco. If you need help quitting, ask your doctor. Do not use alcohol or drugs. Avoid stress. Rest and get plenty of sleep. Regular exercise can help. Ask your doctor what kinds of exercise are best for you. General instructions Take over-the-counter and prescription medicines only as told by your doctor. Keep all prenatal and follow-up visits as told by your doctor. This is important. Contact a doctor if: You have symptoms that your doctor told you to watch for, such as: Headaches. Nausea. Vomiting. Belly (abdominal) pain. Dizziness. Light-headedness. Get help right away if: You have: Very bad belly pain that does not get better with treatment. A very bad headache that does not get better. Vomiting that does not get better. Sudden, fast weight gain. Sudden swelling in your hands, ankles, or face. Blood in your pee. Blurry vision. Double vision.  Shortness of breath. Chest pain. Weakness on one side of your body. Trouble talking. Summary High blood pressure is also called hypertension. High blood pressure means that the force of your blood moving in your body is too strong. High blood pressure can cause problems for you and your baby. Keep all  follow-up visits as told by your doctor. This is important. This information is not intended to replace advice given to you by your health care provider. Make sure you discuss any questions you have with your health care provider. Document Released: 05/27/2010 Document Revised: 08/15/2018 Document Reviewed: 05/21/2018 Elsevier Patient Education  2020 [employer].

## 2024-03-26 ENCOUNTER — Ambulatory Visit

## 2024-03-26 ENCOUNTER — Encounter: Admitting: Obstetrics and Gynecology

## 2024-03-28 ENCOUNTER — Ambulatory Visit

## 2024-03-31 ENCOUNTER — Ambulatory Visit

## 2024-03-31 VITALS — BP 127/81 | HR 86 | Wt 118.7 lb

## 2024-03-31 DIAGNOSIS — Z0130 Encounter for examination of blood pressure without abnormal findings: Secondary | ICD-10-CM

## 2024-03-31 NOTE — Progress Notes (Signed)
..  Subjective:  Sherry Barton is a G1P1001 here for BP check.  She is 1 week postpartum following a normal spontaneous vaginal delivery. Patient reports taking Nifedipine  as prescribed.   Hypertension ROS: Patient denies headache, visual changes, and dizziness   Objective:  BP 127/81   Pulse 86   Wt 118 lb 11.2 oz (53.8 kg)   LMP 06/21/2023 (Exact Date)   Breastfeeding No   BMI 21.03 kg/m   Appearance alert, well appearing, and in no distress.  Assessment:   Blood Pressure well controlled.   Plan:  Current treatment plan is effective, no change in therapy. Advised to monitor for abnormal symptoms a check BP at home, report to mau or contact office if anything changes. PP visit scheduled for 05/05/24   Laymon JONETTA Crigler, RN

## 2024-04-02 ENCOUNTER — Ambulatory Visit

## 2024-04-07 ENCOUNTER — Ambulatory Visit

## 2024-05-05 ENCOUNTER — Encounter: Payer: Self-pay | Admitting: Obstetrics and Gynecology

## 2024-05-05 ENCOUNTER — Other Ambulatory Visit (HOSPITAL_COMMUNITY)
Admission: RE | Admit: 2024-05-05 | Discharge: 2024-05-05 | Disposition: A | Discharge status: Home / Self Care | Source: Ambulatory Visit | Attending: Obstetrics and Gynecology | Admitting: Obstetrics and Gynecology

## 2024-05-05 ENCOUNTER — Ambulatory Visit: Admitting: Obstetrics and Gynecology

## 2024-05-05 DIAGNOSIS — Z30016 Encounter for initial prescription of transdermal patch hormonal contraceptive device: Secondary | ICD-10-CM | POA: Diagnosis not present

## 2024-05-05 DIAGNOSIS — Z3009 Encounter for other general counseling and advice on contraception: Secondary | ICD-10-CM

## 2024-05-05 MED ORDER — NORELGESTROMIN-ETH ESTRADIOL 150-35 MCG/24HR TD PTWK: 1.0000 | MEDICATED_PATCH | TRANSDERMAL | 15 refills | Status: AC

## 2024-05-05 NOTE — Progress Notes (Unsigned)
 "   Post Partum Visit Note  Sherry Barton is a 23 y.o. G53P1001 female who presents for a postpartum visit. She is 6 weeks postpartum following a normal spontaneous vaginal delivery.  I have fully reviewed the prenatal and intrapartum course. The delivery was at [redacted]w[redacted]d gestational weeks.  Anesthesia: epidural. Postpartum course has been good. Baby is doing well. Baby is feeding by bottle - Similac Advance. Bleeding red. Bowel function is normal. Bladder function is normal. Patient is sexually active. Contraception method is Ortho-Evra patches weekly. Postpartum depression screening: negative.   The pregnancy intention screening data noted above was reviewed. Potential methods of contraception were discussed. The patient elected to proceed with No data recorded.   Edinburgh Postnatal Depression Scale - 05/05/24 1546       Edinburgh Postnatal Depression Scale:  In the Past 7 Days   I have been able to laugh and see the funny side of things. 0    I have looked forward with enjoyment to things. 0    I have blamed myself unnecessarily when things went wrong. 0    I have been anxious or worried for no good reason. 0    I have felt scared or panicky for no good reason. 0    Things have been getting on top of me. 0    I have been so unhappy that I have had difficulty sleeping. 0    I have felt sad or miserable. 0    I have been so unhappy that I have been crying. 0    The thought of harming myself has occurred to me. 0    Edinburgh Postnatal Depression Scale Total 0          Health Maintenance Due  Topic Date Due   HPV VACCINES (2 - 2-dose series) 04/14/2016   COVID-19 Vaccine (1 - 2025-26 season) Never done   Cervical Cancer Screening (Pap smear)  Never done    The following portions of the patient's history were reviewed and updated as appropriate: allergies, current medications, past family history, past medical history, past social history, past surgical history, and problem  list.  Review of Systems Pertinent items are noted in HPI.  Objective:  BP 111/70   Pulse 80   Ht 5' 3 (1.6 m)   Wt 112 lb 6.4 oz (51 kg)   LMP 06/21/2023 (Exact Date)   Breastfeeding No   BMI 19.91 kg/m    General:  alert and cooperative   Breasts:  not indicated  Lungs: Normal rate     Abdomen: {abdomen exam:16834}   Wound   GU exam:  {desc; normal/abnormal/not indicated:14647}       Assessment:    There are no diagnoses linked to this encounter.  *** postpartum exam.   Plan:   Essential components of care per ACOG recommendations:  1.  Mood and well being: Patient with {gen negative/positive:315881} depression screening today. Reviewed local resources for support.  - Patient tobacco use? {tobacco use:25506}  - hx of drug use? {yes/no:25505}    2. Infant care and feeding:  -Patient currently breastmilk feeding? {yes/no:25502}  -Social determinants of health (SDOH) reviewed in EPIC. No concerns***The following needs were identified***  3. Sexuality, contraception and birth spacing - Patient {DOES_DOES WNU:81435} want a pregnancy in the next year.  Desired family size is {NUMBER 1-10:22536} children.  - Reviewed reproductive life planning. Reviewed contraceptive methods based on pt preferences and effectiveness.  Patient desired {Upstream End Methods:24109} today.   -  Discussed birth spacing of 18 months  4. Sleep and fatigue -Encouraged family/partner/community support of 4 hrs of uninterrupted sleep to help with mood and fatigue  5. Physical Recovery  - Discussed patients delivery and complications. She describes her labor as {description:25511} - Patient had a {CHL AMB [phone]}. Patient had a {laceration:25518} laceration. Perineal healing reviewed. Patient expressed understanding - Patient has urinary incontinence? {yes/no:25515} - Patient {ACTION; IS/IS WNU:[id number]} safe to resume physical and sexual activity  6.  Health Maintenance - HM due  items addressed {Yes or If no, why not?:20788} - Last pap smear No results found for: DIAGPAP Pap smear {done:10129} at today's visit.  -Breast Cancer screening indicated? {indicated:25516}  7. Chronic Disease/Pregnancy Condition follow up: {Follow up:25499}  - PCP follow up  Nidia Daring, FNP [facility]  "

## 2024-05-13 LAB — CYTOLOGY - PAP
Comment: NEGATIVE
Diagnosis: UNDETERMINED — AB
High risk HPV: NEGATIVE

## 2024-05-14 ENCOUNTER — Ambulatory Visit: Payer: Self-pay | Admitting: Obstetrics and Gynecology

## 2024-06-06 ENCOUNTER — Ambulatory Visit
Admission: RE | Admit: 2024-06-06 | Discharge: 2024-06-06 | Disposition: A | Discharge status: Home / Self Care | Payer: Self-pay | Source: Ambulatory Visit | Attending: Family Medicine

## 2024-06-06 VITALS — BP 109/73 | HR 89 | Temp 99.2°F | Resp 16

## 2024-06-06 DIAGNOSIS — Z113 Encounter for screening for infections with a predominantly sexual mode of transmission: Secondary | ICD-10-CM | POA: Insufficient documentation

## 2024-06-06 DIAGNOSIS — N898 Other specified noninflammatory disorders of vagina: Secondary | ICD-10-CM | POA: Diagnosis present

## 2024-06-06 DIAGNOSIS — R82998 Other abnormal findings in urine: Secondary | ICD-10-CM | POA: Insufficient documentation

## 2024-06-06 LAB — CERVICOVAGINAL ANCILLARY ONLY
Bacterial Vaginitis (gardnerella): POSITIVE — AB
Candida Glabrata: NEGATIVE
Candida Vaginitis: NEGATIVE
Chlamydia: NEGATIVE
Comment: NEGATIVE
Comment: NEGATIVE
Comment: NEGATIVE
Comment: NEGATIVE
Comment: NEGATIVE
Comment: NORMAL
Neisseria Gonorrhea: NEGATIVE
Trichomonas: NEGATIVE

## 2024-06-06 LAB — POCT URINE PREGNANCY: Preg Test, Ur: NEGATIVE

## 2024-06-06 LAB — POCT URINE DIPSTICK
Blood, UA: NEGATIVE
Glucose, UA: NEGATIVE mg/dL
Nitrite, UA: NEGATIVE
POC PROTEIN,UA: 100 — AB
Spec Grav, UA: 1.025
Urobilinogen, UA: 1 U/dL
pH, UA: 6.5

## 2024-06-06 MED ORDER — VALACYCLOVIR HCL 1 G PO TABS: 1000.0000 mg | ORAL_TABLET | Freq: Two times a day (BID) | ORAL | 0 refills | Status: AC

## 2024-06-06 NOTE — Discharge Instructions (Addendum)
 The clinical contact you with results of the vaginal swab/STD testing and urine culture done today if positive.  Please start Valtrex  antiviral medication twice daily for 10 days while awaiting the results of your testing.  Please follow-up with your PCP or gynecologist if your symptoms are not improving.  Please go to the emergency room for any worsening symptoms.  Hope you feel better soon!

## 2024-06-06 NOTE — ED Triage Notes (Signed)
 Discomfort irritation at incision site redness - Entered by patient  Pt reports she has vaginal irritation and a lot of discharge since Monday. Has gotten worse this week. She had a PAP at the beginning of the month. She also had a baby in November and and had a vaginal tear.

## 2024-06-06 NOTE — ED Provider Notes (Signed)
 " [name]    CSN: [id number] Arrival date & time: 06/06/24  1231      History   Chief Complaint Chief Complaint  Patient presents with   Vaginal Discharge    HPI Sherry Barton is a 24 y.o. female presents for vaginal discharge.  Patient reports 4 to 5 days of a vaginal irritation/discharge that is nonpruritic or malodorous.  Denies any fevers, dysuria, vomiting or flank pain.  No known STD exposure but would like screening.  Patient did have a vaginal delivery in November and did sustain a small tear that was closed with sutures.  Patient states she feels like it looks red and is concerned for infection.  Denies history of HSV.  No OTC treatments have been used.  States her symptoms do not feel consistent with previous BV or yeast infections.  Patient denies breast-feeding.  No other concerns.   Vaginal Discharge   Past Medical History:  Diagnosis Date   Chlamydia     Patient Active Problem List   Diagnosis Date Noted   History of severe pre-eclampsia 03/22/2024   Alpha thalassemia silent carrier 09/27/2023    Past Surgical History:  Procedure Laterality Date   NO PAST SURGERIES      OB History     Gravida  1   Para  1   Term  1   Preterm      AB      Living  1      SAB      IAB      Ectopic      Multiple      Live Births  1            Home Medications    Prior to Admission medications  Medication Sig Start Date End Date Taking? Authorizing Provider  ferrous sulfate  325 (65 FE) MG tablet Take 1 tablet (325 mg total) by mouth every other day for 30 doses. 03/26/24 06/06/24 Yes Abigail Rollo DASEN, MD  norelgestromin -ethinyl estradiol  (XULANE) 150-35 MCG/24HR transdermal patch Place 1 patch onto the skin once a week. Place 1 patch onto the skin on the same day once a week for 3 consecutive weeks. On the fourth week remove the patch and go patch free for one week. 05/05/24  Yes Delores Nidia CROME, FNP  valACYclovir  (VALTREX ) 1000  MG tablet Take 1 tablet (1,000 mg total) by mouth 2 (two) times daily for 10 days. 06/06/24 06/16/24 Yes Merek Niu, Jodi R, NP  Blood Pressure Monitoring (BLOOD PRESSURE KIT) DEVI 1 kit by Does not apply route once a week. Patient not taking: Reported on 05/05/2024 08/22/23   Abigail Rollo DASEN, MD  Doxylamine -Pyridoxine  (DICLEGIS ) 10-10 MG TBEC Take 2 tablets by mouth at bedtime. If symptoms persist, add one tablet in the morning and one in the afternoon Patient not taking: Reported on 05/05/2024 12/21/23   Littie Olam LABOR, NP  furosemide  (LASIX ) 20 MG tablet Take 1 tablet (20 mg total) by mouth daily for 3 days. Patient not taking: Reported on 05/05/2024 03/25/24 03/28/24  Abigail Rollo DASEN, MD  ibuprofen  (ADVIL ) 600 MG tablet Take 1 tablet (600 mg total) by mouth every 6 (six) hours as needed for fever, mild pain (pain score 1-3), moderate pain (pain score 4-6) or cramping. Patient not taking: Reported on 05/05/2024 03/25/24   Abigail Rollo DASEN, MD  NIFEdipine  (ADALAT  CC) 30 MG 24 hr tablet Take 1 tablet (30 mg total) by mouth daily. Patient not taking: Reported on 05/05/2024  03/25/24 05/06/24  Abigail Rollo DASEN, MD  ondansetron  (ZOFRAN -ODT) 4 MG disintegrating tablet Take 1-2 tablets (4-8 mg total) by mouth every 6 (six) hours as needed for nausea or vomiting. Patient not taking: Reported on 05/05/2024 01/04/24   Regino Camie LABOR, CNM  potassium chloride  SA (KLOR-CON  M) 20 MEQ tablet Take 1 tablet (20 mEq total) by mouth daily for 3 days. Patient not taking: Reported on 05/05/2024 03/25/24 03/28/24  Abigail Rollo DASEN, MD  Prenatal Vit-Fe Fumarate-FA (MULTIVITAMIN-PRENATAL) 27-0.8 MG TABS tablet Take 1 tablet by mouth daily at 12 noon. Patient not taking: Reported on 05/05/2024    [provider]  scopolamine  (TRANSDERM-SCOP) 1 MG/3DAYS Place 1 patch (1.5 mg total) onto the skin every 3 (three) days. Patient not taking: Reported on 05/05/2024 12/21/23   Littie Olam LABOR, NP    Family  History Family History  Problem Relation Age of Onset   Healthy Mother    Healthy Father     Social History Social History[1]   Allergies   Compazine  [prochlorperazine ]   Review of Systems Review of Systems  Genitourinary:  Positive for vaginal discharge.     Physical Exam Triage Vital Signs ED Triage Vitals  Encounter Vitals Group     BP 06/06/24 1306 109/73     Girls Systolic BP Percentile --      Girls Diastolic BP Percentile --      Boys Systolic BP Percentile --      Boys Diastolic BP Percentile --      Pulse Rate 06/06/24 1306 89     Resp 06/06/24 1306 16     Temp 06/06/24 1306 99.2 F (37.3 C)     Temp Source 06/06/24 1306 Temporal     SpO2 06/06/24 1306 97 %     Weight --      Height --      Head Circumference --      Peak Flow --      Pain Score 06/06/24 1303 9     Pain Loc --      Pain Education --      Exclude from Growth Chart --    No data found.  Updated Vital Signs BP 109/73 (BP Location: Right Arm)   Pulse 89   Temp 99.2 F (37.3 C) (Temporal)   Resp 16   LMP 05/16/2024 (Approximate)   SpO2 97%   Breastfeeding No   Visual Acuity Right Eye Distance:   Left Eye Distance:   Bilateral Distance:    Right Eye Near:   Left Eye Near:    Bilateral Near:     Physical Exam Vitals and nursing note reviewed. Exam conducted with a chaperone present Therese CMA present for exam).  Constitutional:      Appearance: Normal appearance.  HENT:     Head: Normocephalic and atraumatic.  Eyes:     Pupils: Pupils are equal, round, and reactive to light.  Cardiovascular:     Rate and Rhythm: Normal rate.  Pulmonary:     Effort: Pulmonary effort is normal.  Genitourinary:    Labia:        Right: Lesion present.       Comments: Cluster of ulcerations to the right labia that is tender to palpation.  No lacerations, erythema or swelling. Skin:    General: Skin is warm and dry.  Neurological:     General: No focal deficit present.     Mental  Status: She is alert and oriented to person, place,  and time.  Psychiatric:        Mood and Affect: Mood normal.        Behavior: Behavior normal.      UC Treatments / Results  Labs (all labs ordered are listed, but only abnormal results are displayed) Labs Reviewed  POCT URINE DIPSTICK - Abnormal; Notable for the following components:      Result Value   Clarity, UA cloudy (*)    Bilirubin, UA moderate (*)    Ketones, POC UA large (80) (*)    POC PROTEIN,UA =100 (*)    Leukocytes, UA Trace (*)    All other components within normal limits  POCT URINE PREGNANCY - Normal  URINE CULTURE  SYPHILIS: RPR W/REFLEX TO RPR TITER AND TREPONEMAL ANTIBODIES, TRADITIONAL SCREENING AND DIAGNOSIS ALGORITHM  HIV ANTIBODY (ROUTINE TESTING W REFLEX)  HSV 1/2 PCR (SURFACE)  CERVICOVAGINAL ANCILLARY ONLY    EKG   Radiology No results found.  Procedures Procedures (including critical care time)  Medications Ordered in UC Medications - No data to display  Initial Impression / Assessment and Plan / UC Course  I have reviewed the triage vital signs and the nursing notes.  Pertinent labs & imaging results that were available during my care of the patient were reviewed by me and considered in my medical decision making (see chart for details).     Urine with trace leuks, patient denies any dysuria, will send urine culture.  Vaginal swab/STD testing as ordered and will contact for any positive results.  Vaginal exam concerning for HSV, swab taken and will start patient on Valtrex  while awaiting results.  Will await  all other results prior to starting any additional treatment. PCP or GYN follow-up if symptoms or not improving.  ER precautions reviewed. Final Clinical Impressions(s) / UC Diagnoses   Final diagnoses:  Vaginal discharge  Screening examination for STD (sexually transmitted disease)  Leukocytes in urine  Vaginal lesion     Discharge Instructions      The clinical contact  you with results of the vaginal swab/STD testing and urine culture done today if positive.  Please start Valtrex  antiviral medication twice daily for 10 days while awaiting the results of your testing.  Please follow-up with your PCP or gynecologist if your symptoms are not improving.  Please go to the emergency room for any worsening symptoms.  Hope you feel better soon!    ED Prescriptions     Medication Sig Dispense Auth. Provider   valACYclovir  (VALTREX ) 1000 MG tablet Take 1 tablet (1,000 mg total) by mouth 2 (two) times daily for 10 days. 20 tablet Lakoda Raske, Jodi R, NP      PDMP not reviewed this encounter.     [1]  Social History Tobacco Use   Smoking status: Never    Passive exposure: Never   Smokeless tobacco: Never  Vaping Use   Vaping status: Former   Substances: Nicotine  Substance Use Topics   Alcohol use: Yes    Comment: occasional   Drug use: Yes    Types: Marijuana    Comment: been a long time, sometime 2024     Loreda Myla SAUNDERS, NP 06/06/24 1411  "

## 2024-06-07 ENCOUNTER — Ambulatory Visit (HOSPITAL_COMMUNITY): Payer: Self-pay

## 2024-06-07 LAB — HIV ANTIBODY (ROUTINE TESTING W REFLEX): HIV Screen 4th Generation wRfx: NONREACTIVE

## 2024-06-07 LAB — URINE CULTURE: Culture: NO GROWTH

## 2024-06-07 LAB — SYPHILIS: RPR W/REFLEX TO RPR TITER AND TREPONEMAL ANTIBODIES, TRADITIONAL SCREENING AND DIAGNOSIS ALGORITHM: RPR Ser Ql: NONREACTIVE

## 2024-06-07 LAB — HSV 1/2 PCR (SURFACE)
HSV-1 DNA: NOT DETECTED
HSV-2 DNA: DETECTED — AB

## 2024-06-07 MED ORDER — METRONIDAZOLE 500 MG PO TABS: 500.0000 mg | ORAL_TABLET | Freq: Two times a day (BID) | ORAL | 0 refills | Status: AC

## 2024-06-08 MED ORDER — METRONIDAZOLE 0.75 % VA GEL: 1.0000 | Freq: Every day | VAGINAL | 0 refills | Status: AC

## 2024-08-12 ENCOUNTER — Ambulatory Visit: Admitting: Advanced Practice Midwife
